# Patient Record
Sex: Male | Born: 1950 | Race: Black or African American | Hispanic: No | Marital: Single | Smoking: Never smoker
Health system: Southern US, Community
[De-identification: ages and names within clinical notes are randomized; demographics above are authoritative.]

## PROBLEM LIST (undated history)

## (undated) DIAGNOSIS — M1990 Unspecified osteoarthritis, unspecified site: Secondary | ICD-10-CM

## (undated) DIAGNOSIS — I10 Essential (primary) hypertension: Secondary | ICD-10-CM

## (undated) DIAGNOSIS — R310 Gross hematuria: Principal | ICD-10-CM

## (undated) DIAGNOSIS — H3530 Unspecified macular degeneration: Secondary | ICD-10-CM

## (undated) DIAGNOSIS — M5136 Other intervertebral disc degeneration, lumbar region: Secondary | ICD-10-CM

## (undated) DIAGNOSIS — M1710 Unilateral primary osteoarthritis, unspecified knee: Secondary | ICD-10-CM

## (undated) DIAGNOSIS — Z87442 Personal history of urinary calculi: Secondary | ICD-10-CM

## (undated) DIAGNOSIS — M545 Low back pain: Secondary | ICD-10-CM

## (undated) DIAGNOSIS — I890 Lymphedema, not elsewhere classified: Secondary | ICD-10-CM

## (undated) DIAGNOSIS — E785 Hyperlipidemia, unspecified: Secondary | ICD-10-CM

## (undated) DIAGNOSIS — G8929 Other chronic pain: Secondary | ICD-10-CM

## (undated) HISTORY — DX: Essential (primary) hypertension: I10

## (undated) HISTORY — DX: Unspecified macular degeneration: H35.30

## (undated) HISTORY — DX: Other chronic pain: G89.29

## (undated) HISTORY — DX: Lymphedema, not elsewhere classified: I89.0

## (undated) HISTORY — DX: Low back pain: M54.5

## (undated) HISTORY — DX: Other intervertebral disc degeneration, lumbar region: M51.36

## (undated) HISTORY — DX: Unspecified osteoarthritis, unspecified site: M19.90

## (undated) HISTORY — DX: Unilateral primary osteoarthritis, unspecified knee: M17.10

## (undated) HISTORY — DX: Personal history of urinary calculi: Z87.442

## (undated) HISTORY — DX: Hyperlipidemia, unspecified: E78.5

## (undated) HISTORY — DX: Gross hematuria: R31.0

---

## 2003-11-01 ENCOUNTER — Encounter: Admission: RE | Admit: 2003-11-01 | Discharge: 2004-01-30 | Payer: Self-pay | Admitting: Internal Medicine

## 2004-08-17 ENCOUNTER — Ambulatory Visit: Payer: Self-pay | Admitting: Internal Medicine

## 2005-09-05 ENCOUNTER — Ambulatory Visit: Payer: Self-pay | Admitting: Internal Medicine

## 2005-10-29 ENCOUNTER — Ambulatory Visit: Payer: Self-pay | Admitting: Internal Medicine

## 2006-10-16 ENCOUNTER — Ambulatory Visit: Payer: Self-pay | Admitting: Internal Medicine

## 2006-10-16 LAB — CONVERTED CEMR LAB
ALT: 28 units/L (ref 0–40)
Alkaline Phosphatase: 69 units/L (ref 39–117)
BUN: 16 mg/dL (ref 6–23)
Basophils Relative: 0.8 % (ref 0.0–1.0)
CO2: 32 meq/L (ref 19–32)
Calcium: 9.6 mg/dL (ref 8.4–10.5)
Chol/HDL Ratio, serum: 3.1
Cholesterol: 158 mg/dL (ref 0–200)
Creatinine,U: 186.1 mg/dL
Hemoglobin: 16.2 g/dL (ref 13.0–17.0)
Ketones, ur: NEGATIVE mg/dL
LDL Cholesterol: 77 mg/dL (ref 0–99)
Lymphocytes Relative: 27 % (ref 12.0–46.0)
Neutrophils Relative %: 59 % (ref 43.0–77.0)
Nitrite: NEGATIVE
PSA: 0.46 ng/mL (ref 0.10–4.00)
Potassium: 4.5 meq/L (ref 3.5–5.1)
TSH: 1.91 microintl units/mL (ref 0.35–5.50)
Total Protein: 7.1 g/dL (ref 6.0–8.3)
Urine Glucose: NEGATIVE mg/dL
VLDL: 30 mg/dL (ref 0–40)
WBC: 9.5 10*3/uL (ref 4.5–10.5)
pH: 6 (ref 5.0–8.0)

## 2006-12-04 ENCOUNTER — Ambulatory Visit: Payer: Self-pay | Admitting: Internal Medicine

## 2006-12-04 LAB — CONVERTED CEMR LAB
CO2: 29 meq/L (ref 19–32)
Cholesterol: 121 mg/dL (ref 0–200)
GFR calc Af Amer: 130 mL/min
Glucose, Bld: 139 mg/dL — ABNORMAL HIGH (ref 70–99)
Potassium: 4.3 meq/L (ref 3.5–5.1)
Total CHOL/HDL Ratio: 3
Triglycerides: 149 mg/dL (ref 0–149)

## 2007-06-13 ENCOUNTER — Encounter: Payer: Self-pay | Admitting: Internal Medicine

## 2007-06-13 DIAGNOSIS — E785 Hyperlipidemia, unspecified: Secondary | ICD-10-CM

## 2007-06-13 DIAGNOSIS — M545 Low back pain, unspecified: Secondary | ICD-10-CM | POA: Insufficient documentation

## 2007-06-13 DIAGNOSIS — E119 Type 2 diabetes mellitus without complications: Secondary | ICD-10-CM | POA: Insufficient documentation

## 2007-06-13 DIAGNOSIS — G4733 Obstructive sleep apnea (adult) (pediatric): Secondary | ICD-10-CM | POA: Insufficient documentation

## 2007-06-13 DIAGNOSIS — Z87442 Personal history of urinary calculi: Secondary | ICD-10-CM | POA: Insufficient documentation

## 2007-06-13 DIAGNOSIS — M1990 Unspecified osteoarthritis, unspecified site: Secondary | ICD-10-CM | POA: Insufficient documentation

## 2007-06-13 DIAGNOSIS — I10 Essential (primary) hypertension: Secondary | ICD-10-CM | POA: Insufficient documentation

## 2008-08-04 ENCOUNTER — Ambulatory Visit: Payer: Self-pay | Admitting: Family Medicine

## 2008-10-17 ENCOUNTER — Encounter (INDEPENDENT_AMBULATORY_CARE_PROVIDER_SITE_OTHER): Payer: Self-pay | Admitting: Family Medicine

## 2008-10-17 ENCOUNTER — Ambulatory Visit: Payer: Self-pay | Admitting: Internal Medicine

## 2008-10-17 LAB — CONVERTED CEMR LAB
Alkaline Phosphatase: 65 units/L (ref 39–117)
Creatinine, Ser: 0.69 mg/dL (ref 0.40–1.50)
Eosinophils Absolute: 0.5 10*3/uL (ref 0.0–0.7)
Eosinophils Relative: 5 % (ref 0–5)
Glucose, Bld: 132 mg/dL — ABNORMAL HIGH (ref 70–99)
HCT: 49.5 % (ref 39.0–52.0)
HDL: 46 mg/dL (ref >39)
LDL Cholesterol: 138 mg/dL — ABNORMAL HIGH (ref 0–99)
Lymphs Abs: 3.1 10*3/uL (ref 0.7–4.0)
MCV: 92 fL (ref 78.0–100.0)
Platelets: 224 10*3/uL (ref 150–400)
RDW: 14.7 % (ref 11.5–15.5)
Sodium: 140 meq/L (ref 135–145)
Total Bilirubin: 0.7 mg/dL (ref 0.3–1.2)
Total CHOL/HDL Ratio: 4.5
Total Protein: 6.9 g/dL (ref 6.0–8.3)
Triglycerides: 111 mg/dL (ref <150)
VLDL: 22 mg/dL (ref 0–40)
WBC: 9.3 10*3/uL (ref 4.0–10.5)

## 2008-11-29 ENCOUNTER — Ambulatory Visit: Payer: Self-pay | Admitting: Family Medicine

## 2008-11-29 ENCOUNTER — Ambulatory Visit: Payer: Self-pay | Admitting: *Deleted

## 2008-12-06 ENCOUNTER — Ambulatory Visit: Payer: Self-pay | Admitting: Family Medicine

## 2009-01-10 ENCOUNTER — Ambulatory Visit: Payer: Self-pay | Admitting: Internal Medicine

## 2009-03-15 ENCOUNTER — Ambulatory Visit: Payer: Self-pay | Admitting: Family Medicine

## 2009-03-30 ENCOUNTER — Ambulatory Visit: Payer: Self-pay | Admitting: Family Medicine

## 2009-05-22 ENCOUNTER — Ambulatory Visit: Payer: Self-pay | Admitting: Family Medicine

## 2009-05-29 ENCOUNTER — Encounter (HOSPITAL_BASED_OUTPATIENT_CLINIC_OR_DEPARTMENT_OTHER): Admission: RE | Admit: 2009-05-29 | Discharge: 2009-07-06 | Payer: Self-pay | Admitting: General Surgery

## 2009-05-30 ENCOUNTER — Ambulatory Visit (HOSPITAL_COMMUNITY): Admission: RE | Admit: 2009-05-30 | Discharge: 2009-05-30 | Payer: Self-pay | Admitting: General Surgery

## 2009-06-14 ENCOUNTER — Ambulatory Visit: Payer: Self-pay | Admitting: Vascular Surgery

## 2009-06-14 ENCOUNTER — Ambulatory Visit: Admission: RE | Admit: 2009-06-14 | Discharge: 2009-06-14 | Payer: Self-pay | Admitting: General Surgery

## 2009-06-14 ENCOUNTER — Encounter (HOSPITAL_BASED_OUTPATIENT_CLINIC_OR_DEPARTMENT_OTHER): Payer: Self-pay | Admitting: General Surgery

## 2009-10-26 ENCOUNTER — Ambulatory Visit: Payer: Self-pay | Admitting: Family Medicine

## 2009-11-23 ENCOUNTER — Encounter (HOSPITAL_BASED_OUTPATIENT_CLINIC_OR_DEPARTMENT_OTHER): Admission: RE | Admit: 2009-11-23 | Discharge: 2009-12-18 | Payer: Self-pay | Admitting: Internal Medicine

## 2009-12-26 ENCOUNTER — Ambulatory Visit: Payer: Self-pay | Admitting: Family Medicine

## 2009-12-26 LAB — CONVERTED CEMR LAB
Albumin: 4.2 g/dL (ref 3.5–5.2)
BUN: 18 mg/dL (ref 6–23)
CO2: 24 meq/L (ref 19–32)
Calcium: 9.2 mg/dL (ref 8.4–10.5)
Chloride: 97 meq/L (ref 96–112)
Cholesterol: 222 mg/dL — ABNORMAL HIGH (ref 0–200)
Creatinine, Ser: 0.64 mg/dL (ref 0.40–1.50)
Glucose, Bld: 134 mg/dL — ABNORMAL HIGH (ref 70–99)
HDL: 52 mg/dL (ref >39)
Total CHOL/HDL Ratio: 4.3

## 2010-02-26 ENCOUNTER — Ambulatory Visit: Payer: Self-pay | Admitting: Family Medicine

## 2010-02-26 LAB — CONVERTED CEMR LAB
Albumin: 4.1 g/dL (ref 3.5–5.2)
Alkaline Phosphatase: 58 units/L (ref 39–117)
BUN: 21 mg/dL (ref 6–23)
CO2: 26 meq/L (ref 19–32)
Cholesterol: 183 mg/dL (ref 0–200)
Glucose, Bld: 170 mg/dL — ABNORMAL HIGH (ref 70–99)
HDL: 56 mg/dL (ref >39)
LDL Cholesterol: 109 mg/dL — ABNORMAL HIGH (ref 0–99)
Total Bilirubin: 0.6 mg/dL (ref 0.3–1.2)
Triglycerides: 89 mg/dL (ref <150)
VLDL: 18 mg/dL (ref 0–40)

## 2010-08-22 ENCOUNTER — Encounter (HOSPITAL_BASED_OUTPATIENT_CLINIC_OR_DEPARTMENT_OTHER)
Admission: RE | Admit: 2010-08-22 | Discharge: 2010-09-17 | Payer: Self-pay | Source: Home / Self Care | Attending: General Surgery | Admitting: General Surgery

## 2010-12-26 LAB — GLUCOSE, CAPILLARY: Glucose-Capillary: 131 mg/dL — ABNORMAL HIGH (ref 70–99)

## 2011-01-11 LAB — GLUCOSE, CAPILLARY: Glucose-Capillary: 141 mg/dL — ABNORMAL HIGH (ref 70–99)

## 2011-01-12 LAB — COMPREHENSIVE METABOLIC PANEL
Albumin: 4 g/dL (ref 3.5–5.2)
BUN: 17 mg/dL (ref 6–23)
Calcium: 9.1 mg/dL (ref 8.4–10.5)
Creatinine, Ser: 0.86 mg/dL (ref 0.4–1.5)
Glucose, Bld: 147 mg/dL — ABNORMAL HIGH (ref 70–99)
Potassium: 4.4 mEq/L (ref 3.5–5.1)
Total Protein: 7.4 g/dL (ref 6.0–8.3)

## 2011-01-12 LAB — CBC
HCT: 50 % (ref 39.0–52.0)
MCHC: 34 g/dL (ref 30.0–36.0)
Platelets: 280 10*3/uL (ref 150–400)
RDW: 14 % (ref 11.5–15.5)

## 2011-01-12 LAB — GLUCOSE, CAPILLARY

## 2011-01-12 LAB — DIFFERENTIAL
Lymphocytes Relative: 23 % (ref 12–46)
Lymphs Abs: 2.6 10*3/uL (ref 0.7–4.0)
Monocytes Absolute: 0.9 10*3/uL (ref 0.1–1.0)
Monocytes Relative: 8 % (ref 3–12)
Neutro Abs: 7.6 10*3/uL (ref 1.7–7.7)
Neutrophils Relative %: 67 % (ref 43–77)

## 2011-02-19 NOTE — Assessment & Plan Note (Signed)
[facility]   NAME:  Carlos Wood, Carlos Wood           ACCOUNT NO.:  [id number]   MEDICAL RECORD NO.:  [id number]      DATE OF BIRTH:  1951-07-21   PHYSICIAN:  Leonie Man, M.D.         VISIT DATE:                                   OFFICE VISIT   PROBLEM:  1. Stasis dermatitis with venous leg ulcer left lower extremity      dimensions 3.2 x 1.5 x 0.1.  2. Type 2 diabetes mellitus.  3. Morbid obesity (475 pounds at 6 feet.)   Carlos Wood is a 60 year old gentleman referred courtesy of the  [facility] for evaluation and treatment of his venous leg ulcer and  stasis dermatitis.  In recent weeks he has been treated with Augmentin  and topical steroids without resolution of his signs and symptoms of  erythema and rash as well as an ulcer on the left leg.  He is currently  on Augmentin 875 mg b.i.d.   ALLERGIES:  No known drug allergies.   PAST MEDICAL HISTORY:  Medical problems:  Hypertension, type 2 diabetes  mellitus and obesity.   Surgical history:  No previous surgery.   MEDICATIONS:  1. Amlodipine 5 mg daily.  2. Augmentin 875 mg daily.  3. Betamethasone dipropionate 0.05% applied to legs b.i.d.  4. Furosemide 40 mg b.i.d.  5. Lisinopril 40 mg daily.  6. Metformin 1000 mg b.i.d.  7. Metoprolol 25 mg 1 b.i.d.  8. Trental 400 mg t.i.d.  9. Darvocet 1 tablet p.r.n.   The patient takes the following supplements,  1. Aspirin 81 mg daily.  2. Horse chestnut 300 mg daily.  3. Niacin 1000 mg daily.  4. Fish oil omega III 1000 mg b.i.d.  5. Phytosterols 400 mg b.i.d.  6. Vitamin D 10,000 units daily.   SOCIAL HISTORY:  The patient is a single white English-speaking male.  No tobacco, alcohol or recreational drug use.   PRIMARY CARE PHYSICIAN:  Dr. Audria Nine at [facility].   REVIEW OF SYSTEMS:  Negative in detail except as outlined above in the  present illness and past medical history.   PHYSICAL EXAMINATION:  Alert, oriented.  VITAL SIGNS:   Temperature 98, pulse 61, respirations 20, blood pressure  137/84.  Capillary blood g0lucose 154.  NECK:  Head normocephalic.  EYES:  No scleral icterus.  Oropharynx is benign.  NECK:  Supple.  No palpable thyromegaly or adenopathy.  No jugular  venous distention or no carotid bruits.  LUNGS:  Clear to auscultation bilaterally, although breath sounds are  quite distant.  HEART:  Regular rate and rhythm without murmurs heard.  ABDOMEN: Obese.  No masses or palpable visceromegaly.  No palpable  hernias.  Normoactive bowel sounds.  EXTREMITIES:  Extremity exam showed bilateral edema.  Distal pulses are  not palpable probably secondary to edema.  Distal feet are warm to  palpation.  There is significant erythema of the legs extending from the  feet up to the knees.  There is a discoid rash above the knees which is  not identifiable to me but reminiscence of ringworm.  His calf  measurements on the right are 17.5 cm and on the left 20 cm, ankle  measurements on the right to 10.5 cm on the left  12.5 cm.   The treatment today to the ulcer itself.  We will put on Silver  Alginate.  His skin is covered with ketoconazole with TCA cream. We have  applied bilateral Unna boots.  We will ask him to continue his  Augmentin.  We have scheduled for him arterial and venous Dopplers of  the legs.  Follow up will be in 1 week for [name].      Leonie Man, M.D.  Electronically Signed     PB/MEDQ  D:  05/30/2009  T:  05/31/2009  Job:  [id number]

## 2011-02-19 NOTE — Assessment & Plan Note (Signed)
[facility]   NAME:  Carlos Wood, Carlos Wood           ACCOUNT NO.:  [id number]   MEDICAL RECORD NO.:  [id number]      DATE OF BIRTH:  1950/10/10   PHYSICIAN:  Leonie Man, M.D.    VISIT DATE:  06/06/2009                                   OFFICE VISIT   PROBLEM:  Stasis dermatitis with venous leg ulcer of the left lower  extremity, current dimensions 2.9 x 1.4 x 0.1 which is reduced from his  last most recent visit.   [name] is a 60 year old man with the above-stated stasis  dermatitis and venous leg ulcer.  He returns now for reevaluation of his  wound.  On his last visit, he was treated with topical skin barrier of  TCA ketoconazole followed by silver alginate dressing to his leg wound  and an Radio broadcast assistant.   Examination of the leg is better than when seen last.  The target-like  rash that was seen on his leg is significantly less after he had been on  ciprofloxacin for a pseudomonas infection that was picked up at the  cultures done at his most recent visit.  The patient's vital signs are  temperature 97.6, pulse 67, respirations 20, blood pressure 125/74.   The area of the wound in the medial aspect of left ankle, there is a  fairly angry-looking stasis dermatitis, although the ulcer has gotten  smaller over the past week.  This area is selectively debrided with a  curette.  Wound dressings are with ketoconazole TCA barrier cream  followed by Silvercel to the leg wound followed by an Unna boot  bilaterally and Xtrasorb to the left leg area under [place]an.   Followup for this patient will be in 1 week.      Leonie Man, M.D.  Electronically Signed     PB/MEDQ  D:  06/06/2009  T:  06/07/2009  Job:  [id number]

## 2011-07-14 ENCOUNTER — Encounter: Payer: Self-pay | Admitting: Internal Medicine

## 2011-07-14 DIAGNOSIS — Z0000 Encounter for general adult medical examination without abnormal findings: Secondary | ICD-10-CM | POA: Insufficient documentation

## 2011-07-17 ENCOUNTER — Other Ambulatory Visit: Payer: Self-pay | Admitting: Internal Medicine

## 2011-07-17 ENCOUNTER — Other Ambulatory Visit (INDEPENDENT_AMBULATORY_CARE_PROVIDER_SITE_OTHER): Payer: Medicare Other

## 2011-07-17 ENCOUNTER — Encounter: Payer: Self-pay | Admitting: Internal Medicine

## 2011-07-17 ENCOUNTER — Ambulatory Visit (INDEPENDENT_AMBULATORY_CARE_PROVIDER_SITE_OTHER): Payer: Medicare Other | Admitting: Internal Medicine

## 2011-07-17 VITALS — BP 132/72 | HR 62 | Temp 98.6°F | Ht 72.0 in | Wt >= 6400 oz

## 2011-07-17 DIAGNOSIS — E785 Hyperlipidemia, unspecified: Secondary | ICD-10-CM

## 2011-07-17 DIAGNOSIS — E119 Type 2 diabetes mellitus without complications: Secondary | ICD-10-CM

## 2011-07-17 DIAGNOSIS — M51369 Other intervertebral disc degeneration, lumbar region without mention of lumbar back pain or lower extremity pain: Secondary | ICD-10-CM

## 2011-07-17 DIAGNOSIS — G8929 Other chronic pain: Secondary | ICD-10-CM | POA: Insufficient documentation

## 2011-07-17 DIAGNOSIS — R310 Gross hematuria: Secondary | ICD-10-CM

## 2011-07-17 DIAGNOSIS — Z87442 Personal history of urinary calculi: Secondary | ICD-10-CM | POA: Insufficient documentation

## 2011-07-17 DIAGNOSIS — M5136 Other intervertebral disc degeneration, lumbar region: Secondary | ICD-10-CM

## 2011-07-17 DIAGNOSIS — I10 Essential (primary) hypertension: Secondary | ICD-10-CM

## 2011-07-17 DIAGNOSIS — R5383 Other fatigue: Secondary | ICD-10-CM | POA: Insufficient documentation

## 2011-07-17 DIAGNOSIS — M545 Low back pain, unspecified: Secondary | ICD-10-CM | POA: Insufficient documentation

## 2011-07-17 DIAGNOSIS — H3530 Unspecified macular degeneration: Secondary | ICD-10-CM

## 2011-07-17 DIAGNOSIS — M179 Osteoarthritis of knee, unspecified: Secondary | ICD-10-CM

## 2011-07-17 DIAGNOSIS — R5381 Other malaise: Secondary | ICD-10-CM

## 2011-07-17 DIAGNOSIS — M1710 Unilateral primary osteoarthritis, unspecified knee: Secondary | ICD-10-CM

## 2011-07-17 DIAGNOSIS — I890 Lymphedema, not elsewhere classified: Secondary | ICD-10-CM

## 2011-07-17 HISTORY — DX: Lymphedema, not elsewhere classified: I89.0

## 2011-07-17 HISTORY — DX: Other intervertebral disc degeneration, lumbar region: M51.36

## 2011-07-17 HISTORY — DX: Unilateral primary osteoarthritis, unspecified knee: M17.10

## 2011-07-17 HISTORY — DX: Low back pain, unspecified: M54.50

## 2011-07-17 HISTORY — DX: Unspecified macular degeneration: H35.30

## 2011-07-17 HISTORY — DX: Other chronic pain: G89.29

## 2011-07-17 HISTORY — DX: Other intervertebral disc degeneration, lumbar region without mention of lumbar back pain or lower extremity pain: M51.369

## 2011-07-17 HISTORY — DX: Gross hematuria: R31.0

## 2011-07-17 HISTORY — DX: Osteoarthritis of knee, unspecified: M17.9

## 2011-07-17 LAB — CBC WITH DIFFERENTIAL/PLATELET
Basophils Absolute: 0.1 10*3/uL (ref 0.0–0.1)
Eosinophils Absolute: 0.5 10*3/uL (ref 0.0–0.7)
HCT: 47.3 % (ref 39.0–52.0)
Hemoglobin: 15.8 g/dL (ref 13.0–17.0)
Lymphs Abs: 3.6 10*3/uL (ref 0.7–4.0)
MCHC: 33.4 g/dL (ref 30.0–36.0)
Monocytes Absolute: 1 10*3/uL (ref 0.1–1.0)
Neutro Abs: 6.1 10*3/uL (ref 1.4–7.7)
Platelets: 253 10*3/uL (ref 150.0–400.0)
RDW: 14.2 % (ref 11.5–14.6)

## 2011-07-17 LAB — BASIC METABOLIC PANEL
CO2: 29 mEq/L (ref 19–32)
Calcium: 9.8 mg/dL (ref 8.4–10.5)
Chloride: 97 mEq/L (ref 96–112)
Creatinine, Ser: 0.7 mg/dL (ref 0.4–1.5)
Glucose, Bld: 133 mg/dL — ABNORMAL HIGH (ref 70–99)
Sodium: 137 mEq/L (ref 135–145)

## 2011-07-17 LAB — HEPATIC FUNCTION PANEL
AST: 24 U/L (ref 0–37)
Alkaline Phosphatase: 65 U/L (ref 39–117)
Bilirubin, Direct: 0.1 mg/dL (ref 0.0–0.3)

## 2011-07-17 LAB — URINALYSIS, ROUTINE W REFLEX MICROSCOPIC
Ketones, ur: NEGATIVE
Specific Gravity, Urine: 1.025 (ref 1.000–1.030)
Urine Glucose: NEGATIVE
Urobilinogen, UA: 0.2 (ref 0.0–1.0)
pH: 5.5 (ref 5.0–8.0)

## 2011-07-17 LAB — LIPID PANEL
Cholesterol: 222 mg/dL — ABNORMAL HIGH (ref 0–200)
Total CHOL/HDL Ratio: 4
Triglycerides: 284 mg/dL — ABNORMAL HIGH (ref 0.0–149.0)

## 2011-07-17 LAB — MICROALBUMIN / CREATININE URINE RATIO
Creatinine,U: 105.2 mg/dL
Microalb Creat Ratio: 2.8 mg/g (ref 0.0–30.0)

## 2011-07-17 LAB — TSH: TSH: 3.82 u[IU]/mL (ref 0.35–5.50)

## 2011-07-17 MED ORDER — METOPROLOL TARTRATE 25 MG PO TABS: 25.0000 mg | ORAL_TABLET | Freq: Two times a day (BID) | ORAL | Status: DC

## 2011-07-17 MED ORDER — AMLODIPINE BESYLATE 5 MG PO TABS: 5.0000 mg | ORAL_TABLET | Freq: Every day | ORAL | Status: DC

## 2011-07-17 MED ORDER — METFORMIN HCL 500 MG PO TABS: ORAL_TABLET | ORAL | Status: DC

## 2011-07-17 MED ORDER — LISINOPRIL 40 MG PO TABS: 40.0000 mg | ORAL_TABLET | Freq: Every day | ORAL | Status: DC

## 2011-07-17 MED ORDER — ATORVASTATIN CALCIUM 10 MG PO TABS: 10.0000 mg | ORAL_TABLET | Freq: Every day | ORAL | Status: DC

## 2011-07-17 NOTE — Assessment & Plan Note (Signed)
Etiology unclear, Exam otherwise benign, to check labs as documented, follow with expectant management  

## 2011-07-17 NOTE — Assessment & Plan Note (Signed)
stable overall by hx and exam, most recent data reviewed with pt, and pt to continue medical treatment as before  Lab Results  Component Value Date   HGBA1C 7.3* 12/04/2006

## 2011-07-17 NOTE — Patient Instructions (Addendum)
Take all new medications as prescribed - the lipitor Continue all other medications as before All refills were sent to your pharmacy Please go to LAB in the Basement for the blood and/or urine tests to be done today Please call the phone number [phone] (the PhoneTree System) for results of testing in 2-3 days;  When calling, simply dial the number, and when prompted enter the MRN number above (the Medical Record Number) and the # key, then the message should start. You will be contacted regarding the referral for: urology Please return in 6 months, or sooner if needed

## 2011-07-17 NOTE — Assessment & Plan Note (Signed)
stable overall by hx and exam, most recent data reviewed with pt, and pt to continue medical treatment as before le  BP Readings from Last 3 Encounters:  07/17/11 132/72  12/04/06 152/86

## 2011-07-17 NOTE — Assessment & Plan Note (Signed)
1 episode painless about 2 mo ago; given age and risk factors including hx of actos use in the past, will refer to urology for further eval, for UA today

## 2011-07-17 NOTE — Progress Notes (Signed)
  Subjective:    Patient ID: Carlos Wood, male    DOB: May 22, 1951, 60 y.o.   MRN: [id number]  HPI  Here to f/u; overall doing ok,  Pt denies chest pain, increased sob or doe, wheezing, orthopnea, PND, increased LE swelling, palpitations, dizziness or syncope.  Pt denies new neurological symptoms such as new headache, or facial or extremity weakness or numbness   Pt denies polydipsia, polyuria, or low sugar symptoms such as weakness or confusion improved with po intake.  Pt states overall good compliance with meds, trying to follow lower cholesterol, diabetic diet, wt overall stable but little exercise however. Declines flu shot and tetanus.  Does mention he has remote hx of kidney stones.  Did have episode of painless hematuria 2 mo ago, no fever and none since then.   Does have sense of ongoing fatigue, but denies signficant hypersomnolence.  Past Medical History  Diagnosis Date  . Arthritis   . Diabetes mellitus   . Hyperlipidemia   . Hypertension   . Macular degeneration of left eye 07/17/2011  . Arthritis of knee, degenerative 07/17/2011  . Chronic LBP 07/17/2011  . DDD (degenerative disc disease), lumbar 07/17/2011  . Acquired lymphedema of leg 07/17/2011  . History of kidney stones    No past surgical history on file.  reports that he has never smoked. He does not have any smokeless tobacco history on file. He reports that he does not drink alcohol or use illicit drugs. family history includes Arthritis in his other; Hyperlipidemia in his others; and Stroke in his other. Allergies  Allergen Reactions  . Crestor (Rosuvastatin Calcium)     Myalgia  . Pravastatin     Myalgia, mental foggy   No current outpatient prescriptions on file prior to visit.   Review of Systems Review of Systems  Constitutional: Negative for diaphoresis and unexpected weight change.  HENT: Negative for drooling and tinnitus.   Eyes: Negative for photophobia and visual disturbance.  Respiratory:  Negative for choking and stridor.   Gastrointestinal: Negative for vomiting and blood in stool.  Genitourinary: Negative for hematuria and decreased urine volume.  Musculoskeletal: Negative for gait problem.  Skin: Negative for color change and wound.  Neurological: Negative for tremors and numbness.  Psychiatric/Behavioral: Negative for decreased concentration. The patient is not hyperactive.      Objective:   Physical Exam BP 132/72  Pulse 62  Temp(Src) 98.6 F (37 C) (Oral)  Ht 6' (1.829 m)  Wt 443 lb 6 oz (201.114 kg)  BMI 60.13 kg/m2  SpO2 96% Physical Exam  VS noted Constitutional: Pt appears well-developed and well-nourished.  HENT: Head: Normocephalic.  Right Ear: External ear normal.  Left Ear: External ear normal.  Eyes: Conjunctivae and EOM are normal. Pupils are equal, round, and reactive to light.  Neck: Normal range of motion. Neck supple.  Cardiovascular: Normal rate and regular rhythm.   Pulmonary/Chest: Effort normal and breath sounds normal.  Abd:  Soft, NT, non-distended, + BS Neurological: Pt is alert. No cranial nerve deficit.  Skin: Skin is warm. No erythema. Has chronic bilat 1-2+ LE lymphedema Psychiatric: Pt behavior is normal. Thought content normal.     Assessment & Plan:

## 2011-07-17 NOTE — Assessment & Plan Note (Signed)
Likely uncontrolled, to check lipids, start lipitor  asd

## 2011-08-15 ENCOUNTER — Other Ambulatory Visit: Payer: Self-pay | Admitting: Urology

## 2011-08-19 ENCOUNTER — Telehealth: Payer: Self-pay

## 2011-08-19 DIAGNOSIS — M25559 Pain in unspecified hip: Secondary | ICD-10-CM

## 2011-08-19 DIAGNOSIS — M79606 Pain in leg, unspecified: Secondary | ICD-10-CM

## 2011-08-19 NOTE — Telephone Encounter (Signed)
Done per emr 

## 2011-08-19 NOTE — Telephone Encounter (Signed)
Pt called requesting referral to ortho for recurrent LT thigh and hip pain.

## 2011-08-20 ENCOUNTER — Ambulatory Visit
Admission: RE | Admit: 2011-08-20 | Discharge: 2011-08-20 | Disposition: A | Discharge status: Home / Self Care | Payer: Medicare Other | Source: Ambulatory Visit | Attending: Urology | Admitting: Urology

## 2011-08-20 MED ORDER — IOHEXOL 300 MG/ML  SOLN
125.0000 mL | Freq: Once | INTRAMUSCULAR | Status: AC | PRN
  Administered 2011-08-20: 125 mL via INTRAVENOUS

## 2011-08-26 NOTE — H&P (Signed)
  NAMEKAILER, HEINDEL           ACCOUNT NO.:  [id number]  MEDICAL RECORD NO.:  [id number]          PATIENT TYPE:  REC  LOCATION:  FOOT                         FACILITY:  [facility]  PHYSICIAN:  Ardath Sax, M.D.     DATE OF BIRTH:  29-Aug-1951  DATE OF ADMISSION:  08/22/2010 DATE OF DISCHARGE:                             HISTORY & PHYSICAL   Carlos Wood is a 61 year old gentleman who returns suffering from venous stasis and venous stasis ulcers, especially on the left leg.  He is morbidly obese and has had stasis dermatitis and leg ulcers for quite some time.  He has been treated in the past with TCA cream and Unna boots which I prescribed this time and he will come back in a week.  I also put him on doxycycline as he has bilateral cellulitis.  He is a diabetic and is on metformin.  He is also hypertensive and is on lisinopril and Lasix.  He also is on Trental.  The ulcers are fairly superficial and clean, so he will come back in 1 week and we will change the Unna boots.     Ardath Sax, M.D.     PP/MEDQ  D:  08/22/2010  T:  08/23/2010  Job:  [id number]  Electronically Signed by Ardath Sax  on 08/26/2011 01:35:32 PM

## 2011-10-04 ENCOUNTER — Telehealth: Payer: Self-pay | Admitting: *Deleted

## 2011-10-04 NOTE — Telephone Encounter (Signed)
Ok to reduce lasix to 40 qam, pt to follow his wt daily at home, and call for increeased swelling or wt gain > 5 lbs

## 2011-10-04 NOTE — Telephone Encounter (Signed)
Pt is wanting his furosemide modified. Pt states this medication is causing him to go to the bathroom multiple times per day. He is wanting to switch to a milder diuretic, what do you advise for pt?

## 2011-10-04 NOTE — Telephone Encounter (Signed)
Per pt he states that he has already has been taking medication once daily and still feels its too much frequent urination which makes it hard for him to leave home. He also c/o joint pain while on medication. Please advise Thanks

## 2011-10-04 NOTE — Telephone Encounter (Signed)
The Medication itself does not cause joint pain  I would cont the once daily lasix

## 2011-10-07 NOTE — Telephone Encounter (Signed)
Patient informed of MD's instructions

## 2012-01-14 ENCOUNTER — Ambulatory Visit: Payer: Medicare Other | Admitting: Internal Medicine

## 2012-03-30 ENCOUNTER — Telehealth: Payer: Self-pay

## 2012-03-30 DIAGNOSIS — I83009 Varicose veins of unspecified lower extremity with ulcer of unspecified site: Secondary | ICD-10-CM

## 2012-03-30 NOTE — Telephone Encounter (Signed)
Patient informed referral done.

## 2012-03-30 NOTE — Telephone Encounter (Signed)
Pt called requesting referral to [facility] for recurrent venostasis ulcers.

## 2012-03-30 NOTE — Telephone Encounter (Signed)
Done per emr 

## 2012-04-03 ENCOUNTER — Encounter (HOSPITAL_BASED_OUTPATIENT_CLINIC_OR_DEPARTMENT_OTHER): Payer: Medicare Other | Attending: General Surgery

## 2012-04-03 DIAGNOSIS — Z79899 Other long term (current) drug therapy: Secondary | ICD-10-CM | POA: Insufficient documentation

## 2012-04-03 DIAGNOSIS — I872 Venous insufficiency (chronic) (peripheral): Secondary | ICD-10-CM | POA: Insufficient documentation

## 2012-04-03 DIAGNOSIS — L02419 Cutaneous abscess of limb, unspecified: Secondary | ICD-10-CM | POA: Insufficient documentation

## 2012-04-03 DIAGNOSIS — L97809 Non-pressure chronic ulcer of other part of unspecified lower leg with unspecified severity: Secondary | ICD-10-CM | POA: Insufficient documentation

## 2012-04-03 NOTE — Progress Notes (Signed)
[facility]  NAME:  JONATHIN, HEINICKE           ACCOUNT NO.:  [id number]  MEDICAL RECORD NO.:  [id number]      DATE OF BIRTH:  07-09-1951  PHYSICIAN:  Ardath Sax, M.D.           VISIT DATE:                                  OFFICE VISIT   Mr. Chancellor returns to the wound clinic.  He has been a patient here several times in the past for venous stasis ulcers.  He returns now with an ulcer on the anterior aspect of his left leg, surrounded by a good 6 inches of cellulitis.  He has a history of diabetes and hypertension and obesity.  Some of the medicines he is on are Lasix, Glucophage, metoprolol, aspirin, niacin, fish oil and vitamins.  PHYSICAL EXAMINATION:  Today, his blood pressure was 173/80, respirations 18, pulse is 60, temperature 98.6.  He has an ulceration about 3 cm in diameter with surrounding cellulitis.  After reading over his chart and his treatments in the past, I elected to put him on doxycycline because of the cellulitis and we are treating him with silver alginate and an Radio broadcast assistant.  The [employer] has worked in the past, so we will treat him as has been successful in the past.  DIAGNOSIS:  Venous stasis with surrounding cellulitis of the left leg. Other diagnoses; hypertension, morbid obesity, diabetes.     Ardath Sax, M.D.     PP/MEDQ  D:  04/03/2012  T:  04/03/2012  Job:  [id number]

## 2012-04-10 ENCOUNTER — Encounter (HOSPITAL_BASED_OUTPATIENT_CLINIC_OR_DEPARTMENT_OTHER): Payer: Medicare Other | Attending: General Surgery

## 2012-04-10 DIAGNOSIS — I890 Lymphedema, not elsewhere classified: Secondary | ICD-10-CM | POA: Insufficient documentation

## 2012-04-10 DIAGNOSIS — Z79899 Other long term (current) drug therapy: Secondary | ICD-10-CM | POA: Insufficient documentation

## 2012-04-10 DIAGNOSIS — I10 Essential (primary) hypertension: Secondary | ICD-10-CM | POA: Insufficient documentation

## 2012-04-10 DIAGNOSIS — E119 Type 2 diabetes mellitus without complications: Secondary | ICD-10-CM | POA: Insufficient documentation

## 2012-04-10 DIAGNOSIS — M1710 Unilateral primary osteoarthritis, unspecified knee: Secondary | ICD-10-CM | POA: Insufficient documentation

## 2012-04-17 NOTE — Progress Notes (Signed)
[facility]  NAME:  Carlos Wood, Carlos Wood           ACCOUNT NO.:  [id number]  MEDICAL RECORD NO.:  [id number]      DATE OF BIRTH:  1951/07/25  PHYSICIAN:  Ardath Sax, M.D.           VISIT DATE:                                  OFFICE VISIT   Carlos Wood is a 61 year old gentleman who has diabetes.  He has hypertension.  He has got hyperlipidemia.  He has got marked lymphedema of his lower extremities.  He has got arthritis of his both of his knees, and he has got kidney stones.  He is on metoprolol for his hypertension.  He is on metformin for his diabetes, and he is also on metoprolol, and he is on tramadol and on a statin drug, and he is on Lasix also 40 mg a day.  This gentleman has been seen in this clinic many times starting in July of this year.  Starting in August 2010, he was seen on 6 occasions in that here and in the following year in 2011, he was seen 7 times all the way up until December 2011.  During this period of time, he was treated repeatedly with compression, therapies of various types including Unna boots.  He has also had several biologic grafts put in place and most recently here, he has been treated with Unna boots both in June 28, July 5, and July 12.  He has finally healed these venous stasis and lymphedema wounds on his legs, and I feel in order to keep him out of the Wound Clinics and out of the hospital.  It would be a medical necessity and great advantage to him if he had lymphedema pumps that he could use at home.  I think it would keep the edema out of his legs so that he would not breakdown in ulcers; therefore, we are going to make an application for home lymphedema pumps for Carlos Wood.     Ardath Sax, M.D.     PP/MEDQ  D:  04/17/2012  T:  04/17/2012  Job:  [id number]

## 2012-05-01 ENCOUNTER — Encounter (HOSPITAL_BASED_OUTPATIENT_CLINIC_OR_DEPARTMENT_OTHER): Payer: Medicare Other

## 2012-07-16 ENCOUNTER — Other Ambulatory Visit: Payer: Self-pay | Admitting: Internal Medicine

## 2012-07-20 ENCOUNTER — Encounter: Payer: Self-pay | Admitting: Internal Medicine

## 2012-07-20 ENCOUNTER — Other Ambulatory Visit (INDEPENDENT_AMBULATORY_CARE_PROVIDER_SITE_OTHER): Payer: Medicare Other

## 2012-07-20 ENCOUNTER — Ambulatory Visit (INDEPENDENT_AMBULATORY_CARE_PROVIDER_SITE_OTHER): Payer: Medicare Other | Admitting: Internal Medicine

## 2012-07-20 VITALS — BP 150/80 | HR 77 | Temp 98.2°F | Ht 72.0 in | Wt >= 6400 oz

## 2012-07-20 DIAGNOSIS — E785 Hyperlipidemia, unspecified: Secondary | ICD-10-CM

## 2012-07-20 DIAGNOSIS — E119 Type 2 diabetes mellitus without complications: Secondary | ICD-10-CM

## 2012-07-20 DIAGNOSIS — N320 Bladder-neck obstruction: Secondary | ICD-10-CM

## 2012-07-20 DIAGNOSIS — I10 Essential (primary) hypertension: Secondary | ICD-10-CM

## 2012-07-20 LAB — LIPID PANEL
LDL Cholesterol: 109 mg/dL — ABNORMAL HIGH (ref 0–99)
Total CHOL/HDL Ratio: 4

## 2012-07-20 LAB — URINALYSIS, ROUTINE W REFLEX MICROSCOPIC
Bilirubin Urine: NEGATIVE
Leukocytes, UA: NEGATIVE
Nitrite: NEGATIVE
Specific Gravity, Urine: >=1.03 (ref 1.000–1.030)
Total Protein, Urine: NEGATIVE
pH: 5.5 (ref 5.0–8.0)

## 2012-07-20 LAB — BASIC METABOLIC PANEL
Chloride: 99 mEq/L (ref 96–112)
Creatinine, Ser: 0.9 mg/dL (ref 0.4–1.5)
Potassium: 5.3 mEq/L — ABNORMAL HIGH (ref 3.5–5.1)
Sodium: 138 mEq/L (ref 135–145)

## 2012-07-20 LAB — MICROALBUMIN / CREATININE URINE RATIO: Microalb, Ur: 4.1 mg/dL — ABNORMAL HIGH (ref 0.0–1.9)

## 2012-07-20 LAB — CBC WITH DIFFERENTIAL/PLATELET
Basophils Absolute: 0.1 10*3/uL (ref 0.0–0.1)
Eosinophils Relative: 5.2 % — ABNORMAL HIGH (ref 0.0–5.0)
HCT: 48.7 % (ref 39.0–52.0)
Lymphocytes Relative: 33.2 % (ref 12.0–46.0)
Monocytes Relative: 8.6 % (ref 3.0–12.0)
Neutrophils Relative %: 52.3 % (ref 43.0–77.0)
Platelets: 255 10*3/uL (ref 150.0–400.0)
RDW: 14 % (ref 11.5–14.6)
WBC: 8.5 10*3/uL (ref 4.5–10.5)

## 2012-07-20 LAB — HEPATIC FUNCTION PANEL
ALT: 32 U/L (ref 0–53)
AST: 30 U/L (ref 0–37)
Bilirubin, Direct: 0.3 mg/dL (ref 0.0–0.3)
Total Bilirubin: 1.2 mg/dL (ref 0.3–1.2)

## 2012-07-20 LAB — HEMOGLOBIN A1C: Hgb A1c MFr Bld: 8.3 % — ABNORMAL HIGH (ref 4.6–6.5)

## 2012-07-20 MED ORDER — LISINOPRIL 40 MG PO TABS: 40.0000 mg | ORAL_TABLET | Freq: Every day | ORAL | Status: DC

## 2012-07-20 MED ORDER — FUROSEMIDE 40 MG PO TABS: 40.0000 mg | ORAL_TABLET | Freq: Two times a day (BID) | ORAL | Status: DC | PRN

## 2012-07-20 MED ORDER — AMLODIPINE BESYLATE 5 MG PO TABS: 5.0000 mg | ORAL_TABLET | Freq: Every day | ORAL | Status: DC

## 2012-07-20 MED ORDER — METOPROLOL TARTRATE 25 MG PO TABS: 25.0000 mg | ORAL_TABLET | Freq: Two times a day (BID) | ORAL | Status: DC

## 2012-07-20 MED ORDER — HYDROCHLOROTHIAZIDE 25 MG PO TABS: 25.0000 mg | ORAL_TABLET | Freq: Every day | ORAL | Status: DC

## 2012-07-20 MED ORDER — ASPIRIN 81 MG PO TBEC: 81.0000 mg | DELAYED_RELEASE_TABLET | Freq: Every day | ORAL | Status: AC

## 2012-07-20 MED ORDER — METFORMIN HCL 500 MG PO TABS: ORAL_TABLET | ORAL | Status: DC

## 2012-07-20 NOTE — Progress Notes (Signed)
Subjective:    Patient ID: Carlos Wood, male    DOB: Apr 07, 1951, 61 y.o.   MRN: [id number]  HPI  Here to f/u; overall doing ok,  Pt denies chest pain, increased sob or doe, wheezing, orthopnea, PND, increased LE swelling, palpitations, dizziness or syncope.  Pt denies new neurological symptoms such as new headache, or facial or extremity weakness or numbness   Pt denies polydipsia, polyuria, or low sugar symptoms such as weakness or confusion improved with po intake.  Pt states overall good compliance with meds, trying to follow lower cholesterol, diabetic diet, wt overall stable but little exercise however.  Did see Dr Brunilda Payor with gross hematuria due to renal stones bilat, no symptoms now, no fever, no pain, not eligible for lithotrypsy due to size. Did also see Dr Cristopher Estimable for bilat knee pain, now on nsaid prn for DJD, iliotibial band.   Pt denies fever, wt loss, night sweats, loss of appetite, or other constitutional symptoms.  Only takes the lasix infrequently due to urinary freq and incontinence - willing to try mild diuretic. Past Medical History  Diagnosis Date  . Arthritis   . Diabetes mellitus   . Hyperlipidemia   . Hypertension   . Macular degeneration of left eye 07/17/2011  . Arthritis of knee, degenerative 07/17/2011  . Chronic LBP 07/17/2011  . DDD (degenerative disc disease), lumbar 07/17/2011  . Acquired lymphedema of leg 07/17/2011  . History of kidney stones   . Gross hematuria 07/17/2011   No past surgical history on file.  reports that he has never smoked. He does not have any smokeless tobacco history on file. He reports that he does not drink alcohol or use illicit drugs. family history includes Arthritis in his other; Hyperlipidemia in his others; and Stroke in his other. Allergies  Allergen Reactions  . Crestor (Rosuvastatin Calcium)     Myalgia  . Pravastatin     Myalgia, mental foggy   Current Outpatient Prescriptions on File Prior to Visit  Medication  Sig Dispense Refill  . amLODipine (NORVASC) 5 MG tablet TAKE ONE TABLET BY MOUTH ONE TIME DAILY  30 tablet  0  . furosemide (LASIX) 40 MG tablet Take 40 mg by mouth 2 (two) times daily.        Marland Kitchen lisinopril (PRINIVIL,ZESTRIL) 40 MG tablet TAKE ONE TABLET BY MOUTH ONE TIME DAILY  30 tablet  0  . metFORMIN (GLUCOPHAGE) 500 MG tablet 2 by mouth 2 times daily  360 tablet  3  . metoprolol tartrate (LOPRESSOR) 25 MG tablet Take 1 tablet (25 mg total) by mouth 2 (two) times daily.  180 tablet  3  . atorvastatin (LIPITOR) 10 MG tablet Take 1 tablet (10 mg total) by mouth daily.  90 tablet  3   Review of Systems  Constitutional: Negative for diaphoresis and unexpected weight change.  HENT: Negative for tinnitus.   Eyes: Negative for photophobia and visual disturbance.  Respiratory: Negative for choking and stridor.   Gastrointestinal: Negative for vomiting and blood in stool.  Genitourinary: Negative for hematuria and decreased urine volume.  Musculoskeletal: Negative for gait problem.  Skin: Negative for color change and wound. except for episode venous stasis ulceration left leg - tx per Dr Jimmey Ralph, wound center Neurological: Negative for tremors and numbness.  Psychiatric/Behavioral: Negative for decreased concentration. The patient is not hyperactive.      Objective:   Physical Exam BP 150/80  Pulse 77  Temp 98.2 F (36.8 C) (Oral)  Ht 6' (1.829  m)  Wt 464 lb 4 oz (210.582 kg)  BMI 62.96 kg/m2  SpO2 94% Physical Exam  VS noted Constitutional: Pt appears well-developed and well-nourished.  HENT: Head: Normocephalic.  Right Ear: External ear normal.  Left Ear: External ear normal.  Eyes: Conjunctivae and EOM are normal. Pupils are equal, round, and reactive to light.  Neck: Normal range of motion. Neck supple.  Cardiovascular: Normal rate and regular rhythm.   Pulmonary/Chest: Effort normal and breath sounds normal.  Abd:  Soft, NT, non-distended, + BS Neurological: Pt is alert. Not  confused  Skin: Skin is warm. No erythema. chronic 1+ edema Psychiatric: Pt behavior is normal. Thought content normal.     Assessment & Plan:

## 2012-07-20 NOTE — Assessment & Plan Note (Signed)
stable overall by hx and exam, most recent data reviewed with pt, and pt to continue medical treatment as before Lab Results  Component Value Date   HGBA1C 7.1* 07/17/2011

## 2012-07-20 NOTE — Assessment & Plan Note (Signed)
Asympt, for psa as he is due,  to f/u any worsening symptoms or concerns

## 2012-07-20 NOTE — Patient Instructions (Addendum)
OK to start the HCTZ 25 mg per day - for fluid pill and blood pressure Continue all other medications as before Please go to LAB in the Basement for the blood and/or urine tests to be done today You will be contacted by phone if any changes need to be made immediately.  Otherwise, you will receive a letter about your results with an explanation. Thank you for enrolling in MyChart. Please follow the instructions below to securely access your online medical record. MyChart allows you to send messages to your doctor, view your test results, renew your prescriptions, schedule appointments, and more. Your medications were refilled today Please return in 6 months, or sooner if needed

## 2012-07-20 NOTE — Assessment & Plan Note (Signed)
Mild increased, to add HCTZ 25 qd,  to f/u any worsening symptoms or concerns BP Readings from Last 3 Encounters:  07/20/12 150/80  07/17/11 132/72  12/04/06 152/86

## 2012-07-20 NOTE — Assessment & Plan Note (Signed)
stable overall by hx and exam, most recent data reviewed with pt, and pt to continue medical treatment as before Lab Results  Component Value Date   LDLCALC 109* 02/26/2010

## 2012-07-21 ENCOUNTER — Other Ambulatory Visit: Payer: Self-pay | Admitting: Internal Medicine

## 2012-07-21 MED ORDER — GLIPIZIDE ER 2.5 MG PO TB24: 2.5000 mg | ORAL_TABLET | Freq: Every day | ORAL | Status: DC

## 2012-08-25 ENCOUNTER — Other Ambulatory Visit: Payer: Self-pay | Admitting: Internal Medicine

## 2012-11-21 ENCOUNTER — Other Ambulatory Visit: Payer: Self-pay

## 2013-01-18 ENCOUNTER — Ambulatory Visit: Payer: Medicare Other | Admitting: Internal Medicine

## 2013-04-22 IMAGING — CT CT ABD-PEL WO/W CM
4 of 10 series · 16 of 36 positions shown, 19 images · IV contrast (omnipaque)
Comparison: None.

CLINICAL DATA: Hematuria, gross hematuria

CT ABDOMEN AND PELVIS WITHOUT AND WITH CONTRAST
TECHNIQUE: Multidetector CT imaging of the abdomen and pelvis was
performed without contrast material in one or both body regions,
followed by contrast material(s) and further sections in one or
both body regions.
Contrast: 125mL OMNIPAQUE IOHEXOL 300 MG/ML IV SOLN

[Series 9: abd/pelvis prone · axial · 0.98mm/px · z∈[-260,-90]mm · 2 of 104 slices shown, 5 images]
[im 35/104  soft-tissue]
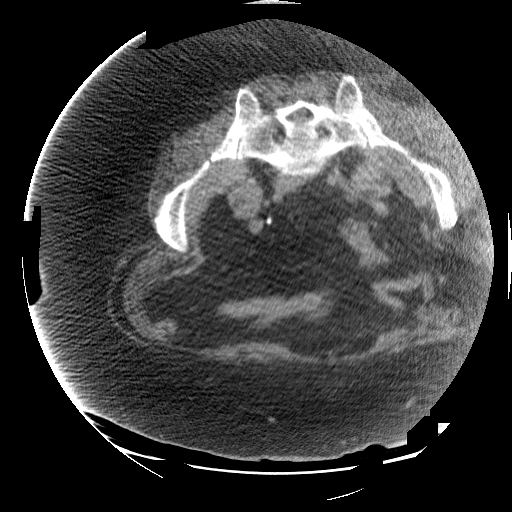
[im 35/104  lung]
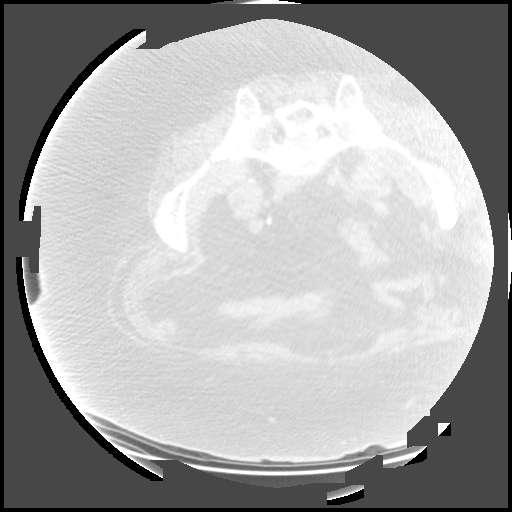
[im 35/104  bone]
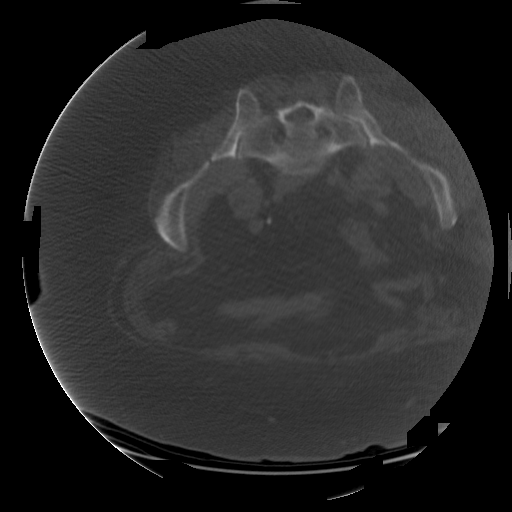
[im 69/104  soft-tissue]
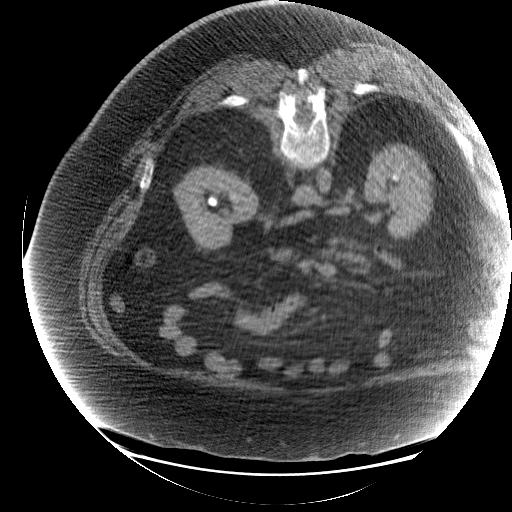
[im 69/104  lung]
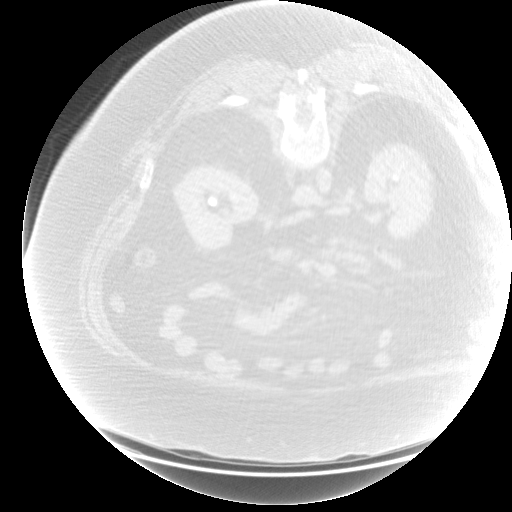

[Series 602: sagittal body · sagittal · 0.98mm/px · 5 of 199 slices shown (1 of 3)]
[im 34/199  soft-tissue]
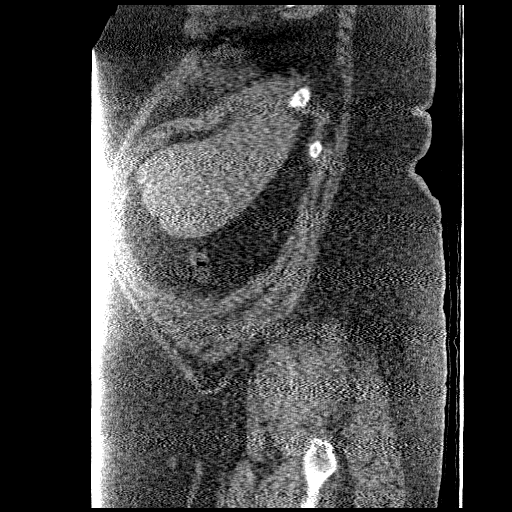
[im 67/199  soft-tissue]
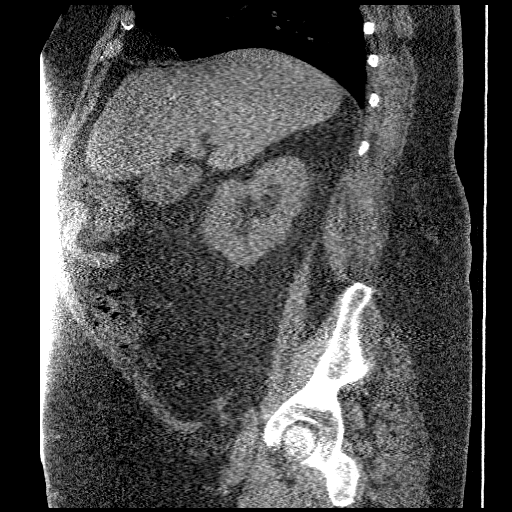
[im 100/199  soft-tissue]
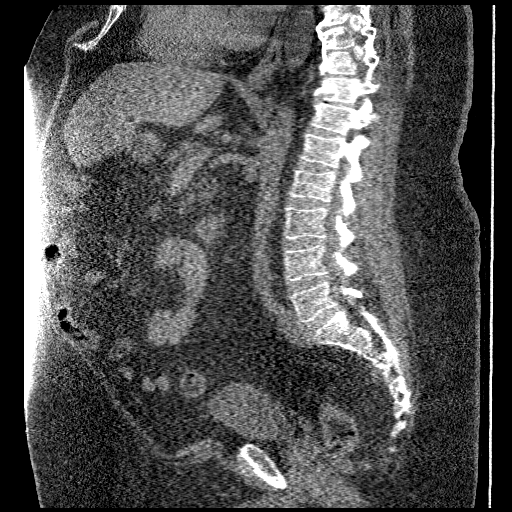
[im 133/199  soft-tissue]
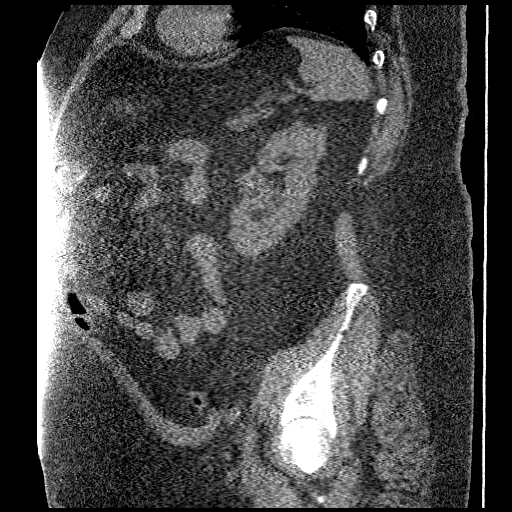
[im 166/199  soft-tissue]
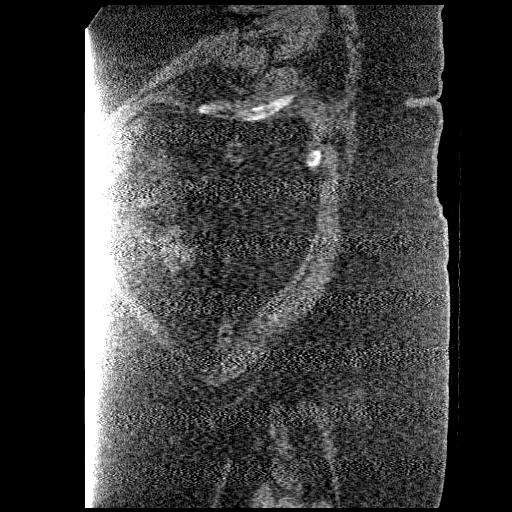

[Series 605: sagittal body · sagittal · 0.98mm/px · 5 of 198 slices shown (2 of 3)]
[im 33/198  soft-tissue]
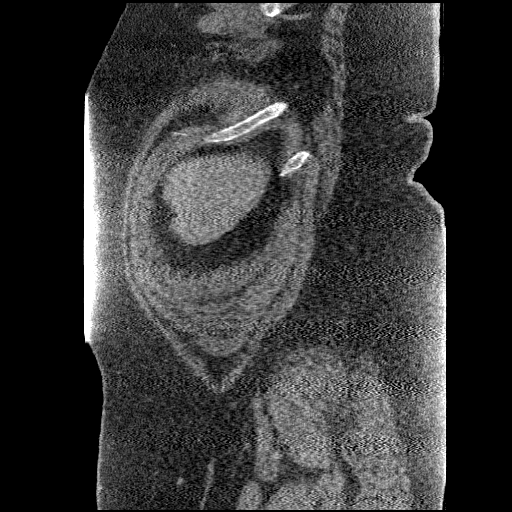
[im 66/198  soft-tissue]
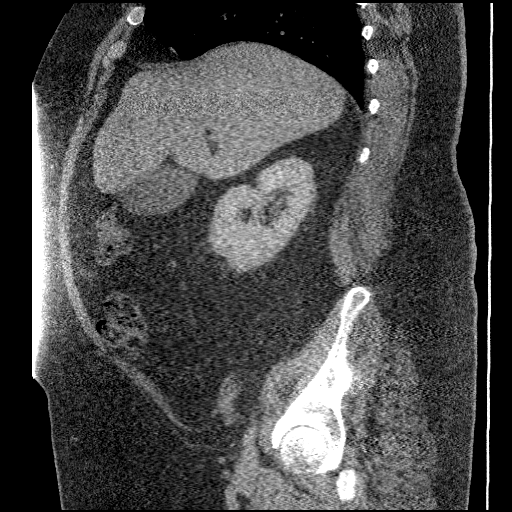
[im 99/198  soft-tissue]
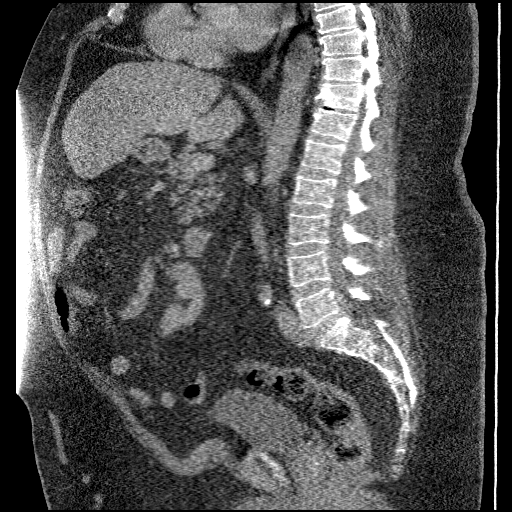
[im 132/198  soft-tissue]
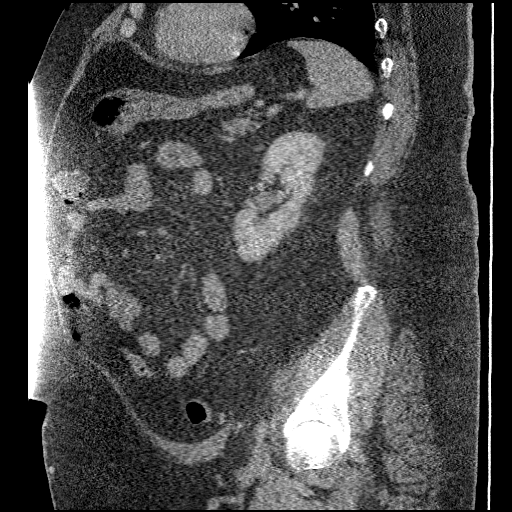
[im 165/198  soft-tissue]
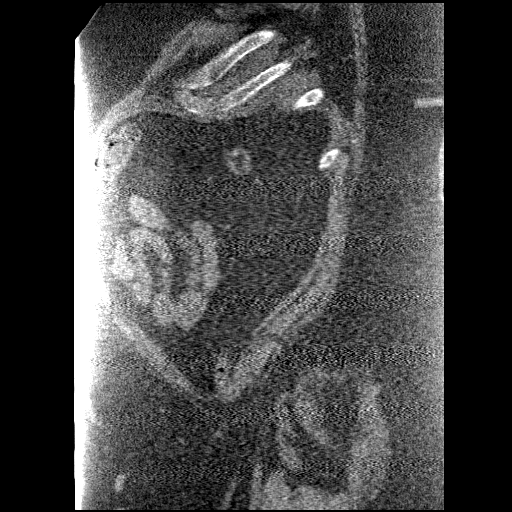

[Series 608: sagittal body · sagittal · 1.01mm/px · 4 of 193 slices shown (3 of 3)]
[im 33/193  soft-tissue]
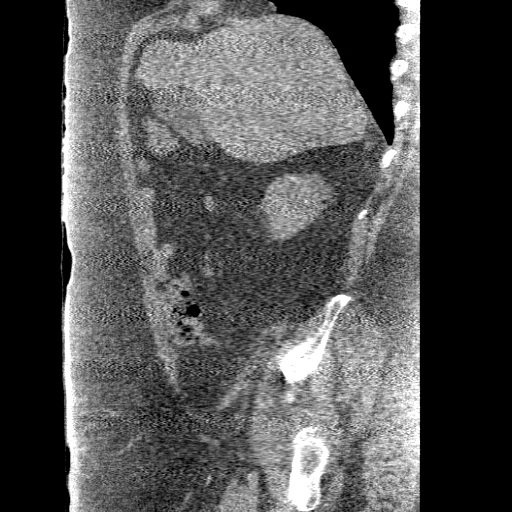
[im 65/193  soft-tissue]
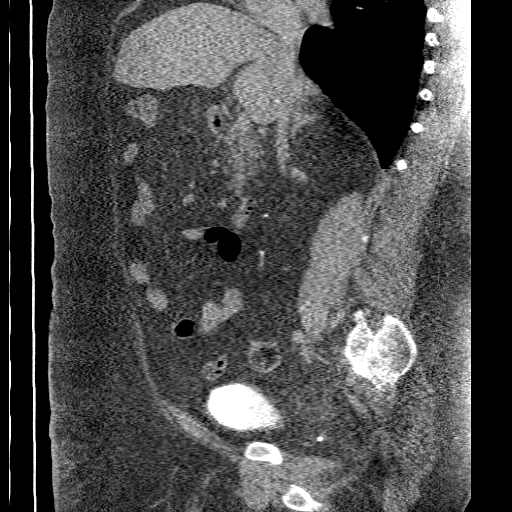
[im 97/193  soft-tissue]
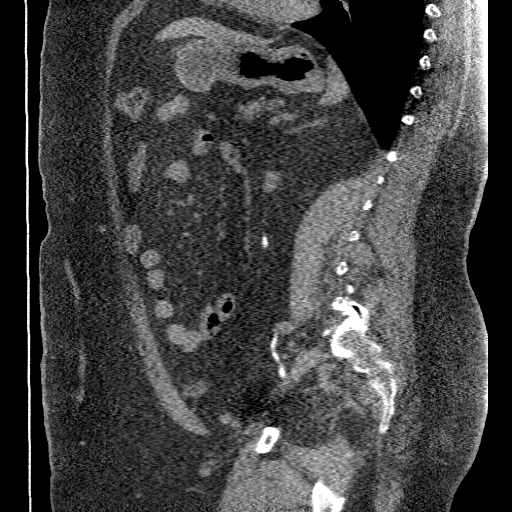
[im 129/193  soft-tissue]
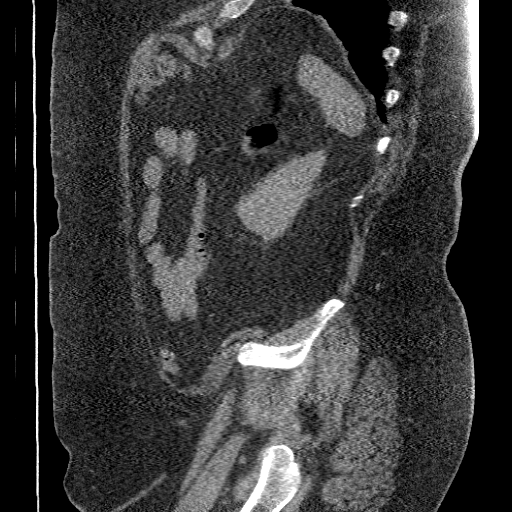

[16 of 36 positions shown; findings below may reference images not displayed]

FINDINGS: Renal:  Non-IV contrast images demonstrate a 9 mm calculus in the
right renal pelvis.  No hydronephrosis on the left. There is a
smaller 4 mm calculus in the lower pole of the left kidney.  There
is a 2 mm calculus within the right mid kidney.  Cortical phase
imaging demonstrates no enhancing renal cortical lesions.
Pyelogram phase imaging demonstrates no filling defects within the
collecting systems or ureters.

Lung bases are clear.  No focal hepatic lesions.  Gallbladder,
pancreas, spleen, adrenal glands are normal.  The stomach, small
bowel, appendix, and colon are normal.

Abdominal aorta normal caliber.  No retroperitoneal
lymphadenopathy.

No free fluid the pelvis.  Bladder is normal.  No pelvic
lymphadenopathy.  No distal ureteral stones or bladder stones.  No
filling defects within the bladder. Review of  bone windows
demonstrates no aggressive osseous lesions.
IMPRESSION: 1.  A 9 mm calculus at the left ureteral pelvic junction without
evidence obstruction.
2. Small left lower pole renal calculus and right mid renal
calculus.

3.  No evidence of ureterolithiasis or obstructive uropathy.

## 2013-07-23 ENCOUNTER — Encounter (HOSPITAL_BASED_OUTPATIENT_CLINIC_OR_DEPARTMENT_OTHER): Payer: Medicare Other | Attending: General Surgery

## 2013-07-23 DIAGNOSIS — M7989 Other specified soft tissue disorders: Secondary | ICD-10-CM | POA: Insufficient documentation

## 2013-07-23 DIAGNOSIS — I10 Essential (primary) hypertension: Secondary | ICD-10-CM | POA: Insufficient documentation

## 2013-07-23 DIAGNOSIS — I890 Lymphedema, not elsewhere classified: Secondary | ICD-10-CM | POA: Insufficient documentation

## 2013-07-23 DIAGNOSIS — I872 Venous insufficiency (chronic) (peripheral): Secondary | ICD-10-CM | POA: Insufficient documentation

## 2013-07-23 DIAGNOSIS — I739 Peripheral vascular disease, unspecified: Secondary | ICD-10-CM | POA: Insufficient documentation

## 2013-07-24 NOTE — Progress Notes (Signed)
[facility]  NAME:  Carlos Wood, Carlos Wood           ACCOUNT NO.:  [id number]  MEDICAL RECORD NO.:  [id number]      DATE OF BIRTH:  08/17/1951  PHYSICIAN:  Ardath Sax, M.D.           VISIT DATE:                                  OFFICE VISIT   A 62 year old morbidly obese male with hypertension and diabetes who enters with venous hypertension and history of chronic venous ulcers of both legs.  The legs are very erythematous and have scars where the old ulcers have epithelialized in.  He has some edema and erythema of the lower half of the calf down into the ankle.  This man has type 2 diabetes and hypertension.  He is on lisinopril and metformin and metoprolol.  He is also on tramadol for pain and Lasix as a diuretic. He weighs 440 pounds and he had a temperature of 97.8, pulse 62, respirations 16, blood pressure 166/84.  We cleansed up his legs and felt that there was no need to put dressings on and we just put compression hose and told him to keep his legs elevated to exercise and when he is up and around to wear the compression hose and to come back here if he ever needed this.  DIAGNOSES: 1. Obesity. 2. Venous stasis. 3. Venous hypertension. 4. Diabetes. 5. Hypertension.     Ardath Sax, M.D.     PP/MEDQ  D:  07/23/2013  T:  07/24/2013  Job:  [id number]

## 2013-08-03 ENCOUNTER — Other Ambulatory Visit: Payer: Self-pay | Admitting: Internal Medicine

## 2013-08-06 ENCOUNTER — Other Ambulatory Visit (INDEPENDENT_AMBULATORY_CARE_PROVIDER_SITE_OTHER): Payer: Medicare Other

## 2013-08-06 ENCOUNTER — Encounter: Payer: Self-pay | Admitting: Internal Medicine

## 2013-08-06 ENCOUNTER — Ambulatory Visit (INDEPENDENT_AMBULATORY_CARE_PROVIDER_SITE_OTHER): Payer: Medicare Other | Admitting: Internal Medicine

## 2013-08-06 ENCOUNTER — Other Ambulatory Visit: Payer: Self-pay | Admitting: Internal Medicine

## 2013-08-06 VITALS — BP 150/72 | HR 70 | Temp 97.1°F | Ht 72.0 in | Wt >= 6400 oz

## 2013-08-06 DIAGNOSIS — E119 Type 2 diabetes mellitus without complications: Secondary | ICD-10-CM

## 2013-08-06 DIAGNOSIS — Z0000 Encounter for general adult medical examination without abnormal findings: Secondary | ICD-10-CM

## 2013-08-06 DIAGNOSIS — M1710 Unilateral primary osteoarthritis, unspecified knee: Secondary | ICD-10-CM

## 2013-08-06 LAB — BASIC METABOLIC PANEL
BUN: 21 mg/dL (ref 6–23)
CO2: 26 mEq/L (ref 19–32)
Calcium: 10 mg/dL (ref 8.4–10.5)
Chloride: 99 mEq/L (ref 96–112)
Creatinine, Ser: 0.6 mg/dL (ref 0.4–1.5)
Glucose, Bld: 187 mg/dL — ABNORMAL HIGH (ref 70–99)
Sodium: 135 mEq/L (ref 135–145)

## 2013-08-06 LAB — PSA: PSA: 0.51 ng/mL (ref 0.10–4.00)

## 2013-08-06 LAB — MICROALBUMIN / CREATININE URINE RATIO
Creatinine,U: 177.3 mg/dL
Microalb Creat Ratio: 3.4 mg/g (ref 0.0–30.0)
Microalb, Ur: 6.1 mg/dL — ABNORMAL HIGH (ref 0.0–1.9)

## 2013-08-06 LAB — CBC WITH DIFFERENTIAL/PLATELET
Basophils Relative: 0.5 % (ref 0.0–3.0)
Eosinophils Absolute: 0.4 10*3/uL (ref 0.0–0.7)
Eosinophils Relative: 4.6 % (ref 0.0–5.0)
Hemoglobin: 15.6 g/dL (ref 13.0–17.0)
Lymphocytes Relative: 30.1 % (ref 12.0–46.0)
MCHC: 33.8 g/dL (ref 30.0–36.0)
Monocytes Relative: 8.7 % (ref 3.0–12.0)
Neutro Abs: 5.2 10*3/uL (ref 1.4–7.7)
Neutrophils Relative %: 56.1 % (ref 43.0–77.0)
RBC: 5.05 Mil/uL (ref 4.22–5.81)
WBC: 9.2 10*3/uL (ref 4.5–10.5)

## 2013-08-06 LAB — HEPATIC FUNCTION PANEL
ALT: 25 U/L (ref 0–53)
Albumin: 4.1 g/dL (ref 3.5–5.2)
Alkaline Phosphatase: 65 U/L (ref 39–117)
Bilirubin, Direct: 0.2 mg/dL (ref 0.0–0.3)
Total Protein: 7.1 g/dL (ref 6.0–8.3)

## 2013-08-06 LAB — URINALYSIS, ROUTINE W REFLEX MICROSCOPIC
Leukocytes, UA: NEGATIVE
Nitrite: NEGATIVE
Urine Glucose: NEGATIVE
WBC, UA: NONE SEEN (ref 0–?)
pH: 5.5 (ref 5.0–8.0)

## 2013-08-06 LAB — LIPID PANEL
Cholesterol: 205 mg/dL — ABNORMAL HIGH (ref 0–200)
Total CHOL/HDL Ratio: 4
Triglycerides: 217 mg/dL — ABNORMAL HIGH (ref 0.0–149.0)

## 2013-08-06 MED ORDER — GLIPIZIDE ER 2.5 MG PO TB24: 2.5000 mg | ORAL_TABLET | Freq: Every day | ORAL | Status: DC

## 2013-08-06 MED ORDER — ATORVASTATIN CALCIUM 20 MG PO TABS: 20.0000 mg | ORAL_TABLET | Freq: Every day | ORAL | Status: AC

## 2013-08-06 MED ORDER — GLIPIZIDE ER 5 MG PO TB24: 5.0000 mg | ORAL_TABLET | Freq: Every day | ORAL | Status: DC

## 2013-08-06 MED ORDER — DICLOFENAC SODIUM 75 MG PO TBEC: 75.0000 mg | DELAYED_RELEASE_TABLET | Freq: Two times a day (BID) | ORAL | Status: AC

## 2013-08-06 MED ORDER — LIDOCAINE 5 % EX PTCH: 1.0000 | MEDICATED_PATCH | CUTANEOUS | Status: AC

## 2013-08-06 MED ORDER — AMLODIPINE BESYLATE 5 MG PO TABS: 5.0000 mg | ORAL_TABLET | Freq: Every day | ORAL | Status: DC

## 2013-08-06 MED ORDER — LISINOPRIL 40 MG PO TABS: 40.0000 mg | ORAL_TABLET | Freq: Every day | ORAL | Status: DC

## 2013-08-06 MED ORDER — TRAMADOL HCL 50 MG PO TABS: 50.0000 mg | ORAL_TABLET | Freq: Three times a day (TID) | ORAL | Status: DC | PRN

## 2013-08-06 MED ORDER — ATORVASTATIN CALCIUM 10 MG PO TABS: 10.0000 mg | ORAL_TABLET | Freq: Every day | ORAL | Status: DC

## 2013-08-06 NOTE — Progress Notes (Signed)
Subjective:    Patient ID: Carlos Wood, male    DOB: 12/15/1950, 62 y.o.   MRN: [id number]  HPI  Here for wellness and f/u;  Overall doing ok;  Pt denies CP, worsening SOB, DOE, wheezing, orthopnea, PND, worsening LE edema, palpitations, dizziness or syncope.  Pt denies neurological change such as new headache, facial or extremity weakness.  Pt denies polydipsia, polyuria, or low sugar symptoms. Pt states overall good compliance with treatment and medications, good tolerability, and has been trying to follow lower cholesterol diet.  Pt denies worsening depressive symptoms, suicidal ideation or panic. No fever, night sweats, wt loss, loss of appetite, or other constitutional symptoms.  Pt states good ability with ADL's, has low fall risk, home safety reviewed and adequate, no other significant changes in hearing or vision, and only occasionally active with exercise.  Has had some transient increased weepiness of persistent LE swelling/redness, just seen per Dr Wynelle [facility] and felt to be ok at that time.  No need for lymphedema pump or other tx at this time.  Decliens immunizations.or colonscopy.  Has renal stones, chronic microhematuria, last saw urology approx 1 yr.  Pt continues to have recurring right knee,no bowel or bladder change, fever, wt loss,  worsening LE pain/numbness/weakness, gait change or falls, or knee giveaways. BP at home with left arm wrist cuff usually < 140/90, declines further med change today  Past Medical History  Diagnosis Date  . Arthritis   . Diabetes mellitus   . Hyperlipidemia   . Hypertension   . Macular degeneration of left eye 07/17/2011  . Arthritis of knee, degenerative 07/17/2011  . Chronic LBP 07/17/2011  . DDD (degenerative disc disease), lumbar 07/17/2011  . Acquired lymphedema of leg 07/17/2011  . History of kidney stones   . Gross hematuria 07/17/2011   No past surgical history on file.  reports that he has never smoked. He does not have any  smokeless tobacco history on file. He reports that he does not drink alcohol or use illicit drugs. family history includes Arthritis in his other; Hyperlipidemia in his other and other; Stroke in his other. Allergies  Allergen Reactions  . Crestor [Rosuvastatin Calcium]     Myalgia  . Pravastatin     Myalgia, mental foggy   Current Outpatient Prescriptions on File Prior to Visit  Medication Sig Dispense Refill  . aspirin 81 MG EC tablet Take 1 tablet (81 mg total) by mouth daily. Swallow whole.  30 tablet  12  . diclofenac (VOLTAREN) 75 MG EC tablet Take 75 mg by mouth 2 (two) times daily.      . furosemide (LASIX) 40 MG tablet Take 1 tablet (40 mg total) by mouth 2 (two) times daily as needed.  100 tablet  2  . metFORMIN (GLUCOPHAGE) 500 MG tablet 2 by mouth 2 times daily  360 tablet  3  . metoprolol tartrate (LOPRESSOR) 25 MG tablet Take 1 tablet (25 mg total) by mouth 2 (two) times daily.  180 tablet  3  . TRAMADOL HCL PO Take by mouth 2 (two) times daily.       No current facility-administered medications on file prior to visit.   Review of Systems Constitutional: Negative for diaphoresis, activity change, appetite change or unexpected weight change.  HENT: Negative for hearing loss, ear pain, facial swelling, mouth sores and neck stiffness.   Eyes: Negative for pain, redness and visual disturbance.  Respiratory: Negative for shortness of breath and wheezing.   Cardiovascular: Negative  for chest pain and palpitations.  Gastrointestinal: Negative for diarrhea, blood in stool, abdominal distention or other pain Genitourinary: Negative for hematuria, flank pain or change in urine volume.  Musculoskeletal: Negative for myalgias and joint swelling.  Skin: Negative for color change and wound.  Neurological: Negative for syncope and numbness. other than noted Hematological: Negative for adenopathy.  Psychiatric/Behavioral: Negative for hallucinations, self-injury, decreased concentration  and agitation.      Objective:   Physical Exam BP 150/72  Pulse 70  Temp(Src) 97.1 F (36.2 C) (Oral)  Ht 6' (1.829 m)  Wt 444 lb 2 oz (201.454 kg)  BMI 60.22 kg/m2  SpO2 93% VS noted,  Constitutional: Pt is oriented to person, place, and time. Appears well-developed and well-nourished.  Head: Normocephalic and atraumatic.  Right Ear: External ear normal.  Left Ear: External ear normal.  Nose: Nose normal.  Mouth/Throat: Oropharynx is clear and moist.  Eyes: Conjunctivae and EOM are normal. Pupils are equal, round, and reactive to light.  Neck: Normal range of motion. Neck supple. No JVD present. No tracheal deviation present.  Cardiovascular: Normal rate, regular rhythm, normal heart sounds and intact distal pulses.   Pulmonary/Chest: Effort normal and breath sounds normal.  Abdominal: Soft. Bowel sounds are normal. There is no tenderness. No HSM  Musculoskeletal: Normal range of motion. Exhibits no edema.  Lymphadenopathy:  Has no cervical adenopathy.  Neurological: Pt is alert and oriented to person, place, and time. Pt has normal reflexes. No cranial nerve deficit.  Skin: Skin is warm and dry. No rash noted. Large lymphedema bilat LE's Psychiatric:  Has  normal mood and affect. Behavior is normal.  Right Knee;    Assessment & Plan:

## 2013-08-06 NOTE — Patient Instructions (Signed)
Please continue all other medications as before, and refills have been done if requested Please have the pharmacy call with any other refills you may need. Please continue your efforts at being more active, low cholesterol diet, and weight control. You are otherwise up to date with prevention measures today. Please keep your appointments with your specialists as you have planned Please continue to monitor your BP at home, with the goal being < 140/90 Please call if you change your mind about seeing Dr Smith/sports medicine for the right knee Please go to the LAB in the Basement (turn left off the elevator) for the tests to be done today You will be contacted by phone if any changes need to be made immediately.  Otherwise, you will receive a letter about your results with an explanation, but please check with MyChart first.  Please remember to sign up for My Chart if you have not done so, as this will be important to you in the future with finding out test results, communicating by private email, and scheduling acute appointments online when needed.  Please return in 6 months, or sooner if needed, with Lab testing done 3-5 days before

## 2013-08-06 NOTE — Assessment & Plan Note (Signed)
stable overall by history and exam, recent data reviewed with pt, and pt to continue medical treatment as before,  to f/u any worsening symptoms or concerns Lab Results  Component Value Date   HGBA1C 8.3* 07/20/2012   For f/u labs

## 2013-08-06 NOTE — Assessment & Plan Note (Signed)

## 2013-08-06 NOTE — Assessment & Plan Note (Signed)
Ok for pain med refills, declines referral to Dr Smith/sports med

## 2013-08-12 ENCOUNTER — Other Ambulatory Visit: Payer: Self-pay

## 2013-10-10 ENCOUNTER — Other Ambulatory Visit: Payer: Self-pay | Admitting: Internal Medicine

## 2013-11-29 ENCOUNTER — Other Ambulatory Visit: Payer: Self-pay | Admitting: Internal Medicine

## 2014-01-10 ENCOUNTER — Encounter: Payer: Self-pay | Admitting: Internal Medicine

## 2014-01-26 ENCOUNTER — Other Ambulatory Visit: Payer: Self-pay | Admitting: Internal Medicine

## 2014-01-26 NOTE — Telephone Encounter (Signed)
Done hardcopy to robin  

## 2014-01-28 NOTE — Telephone Encounter (Signed)
[facility].../[facility]mb 

## 2014-02-02 ENCOUNTER — Other Ambulatory Visit: Payer: Self-pay | Admitting: Internal Medicine

## 2014-02-02 ENCOUNTER — Ambulatory Visit (INDEPENDENT_AMBULATORY_CARE_PROVIDER_SITE_OTHER): Payer: Medicare Other | Admitting: Internal Medicine

## 2014-02-02 ENCOUNTER — Encounter: Payer: Self-pay | Admitting: Internal Medicine

## 2014-02-02 ENCOUNTER — Other Ambulatory Visit (INDEPENDENT_AMBULATORY_CARE_PROVIDER_SITE_OTHER): Payer: Medicare Other

## 2014-02-02 VITALS — BP 120/78 | HR 82 | Temp 97.9°F | Wt >= 6400 oz

## 2014-02-02 DIAGNOSIS — I10 Essential (primary) hypertension: Secondary | ICD-10-CM

## 2014-02-02 DIAGNOSIS — E119 Type 2 diabetes mellitus without complications: Secondary | ICD-10-CM

## 2014-02-02 DIAGNOSIS — E785 Hyperlipidemia, unspecified: Secondary | ICD-10-CM

## 2014-02-02 LAB — BASIC METABOLIC PANEL
BUN: 24 mg/dL — AB (ref 6–23)
CHLORIDE: 97 meq/L (ref 96–112)
CO2: 27 mEq/L (ref 19–32)
CREATININE: 0.9 mg/dL (ref 0.4–1.5)
Calcium: 9 mg/dL (ref 8.4–10.5)
GFR: 87.77 mL/min (ref >60.00)
GLUCOSE: 162 mg/dL — AB (ref 70–99)
Potassium: 4.5 mEq/L (ref 3.5–5.1)
Sodium: 136 mEq/L (ref 135–145)

## 2014-02-02 LAB — LIPID PANEL
CHOLESTEROL: 153 mg/dL (ref 0–200)
HDL: 44 mg/dL (ref >39.00)
LDL Cholesterol: 64 mg/dL (ref 0–99)
Total CHOL/HDL Ratio: 3
Triglycerides: 225 mg/dL — ABNORMAL HIGH (ref 0.0–149.0)
VLDL: 45 mg/dL — ABNORMAL HIGH (ref 0.0–40.0)

## 2014-02-02 LAB — HEMOGLOBIN A1C: HEMOGLOBIN A1C: 7.8 % — AB (ref 4.6–6.5)

## 2014-02-02 LAB — HEPATIC FUNCTION PANEL
ALT: 28 U/L (ref 0–53)
AST: 31 U/L (ref 0–37)
Albumin: 4.1 g/dL (ref 3.5–5.2)
Alkaline Phosphatase: 67 U/L (ref 39–117)
BILIRUBIN DIRECT: 0.1 mg/dL (ref 0.0–0.3)
TOTAL PROTEIN: 7.3 g/dL (ref 6.0–8.3)
Total Bilirubin: 0.9 mg/dL (ref 0.3–1.2)

## 2014-02-02 MED ORDER — GLIPIZIDE ER 10 MG PO TB24: 10.0000 mg | ORAL_TABLET | Freq: Every day | ORAL | Status: AC

## 2014-02-02 NOTE — Assessment & Plan Note (Signed)
stable overall by history and exam, recent data reviewed with pt, and pt to continue medical treatment as before,  to f/u any worsening symptoms or concerns BP Readings from Last 3 Encounters:  02/02/14 120/78  08/06/13 150/72  07/20/12 150/80

## 2014-02-02 NOTE — Progress Notes (Signed)
Subjective:    Patient ID: Carlos Wood, male    DOB: 10/25/1950, 63 y.o.   MRN: [id number]  HPI  Here to f/u; overall doing ok,  Pt denies chest pain, increased sob or doe, wheezing, orthopnea, PND, increased LE swelling, palpitations, dizziness or syncope.  Pt denies polydipsia, polyuria, or low sugar symptoms such as weakness or confusion improved with po intake.  Pt denies new neurological symptoms such as new headache, or facial or extremity weakness or numbness.   Pt states overall good compliance with meds, has been trying to follow lower cholesterol, diabetic diet, with wt overall stable,  but little exercise however.  Cont's to see wound clinic.  Needs letter for permanent out of jury duty. No new complaints.  Declines immunizations and colonsocpy Past Medical History  Diagnosis Date  . Arthritis   . Diabetes mellitus   . Hyperlipidemia   . Hypertension   . Macular degeneration of left eye 07/17/2011  . Arthritis of knee, degenerative 07/17/2011  . Chronic LBP 07/17/2011  . DDD (degenerative disc disease), lumbar 07/17/2011  . Acquired lymphedema of leg 07/17/2011  . History of kidney stones   . Gross hematuria 07/17/2011   No past surgical history on file.  reports that he has never smoked. He does not have any smokeless tobacco history on file. He reports that he does not drink alcohol or use illicit drugs. family history includes Arthritis in his other; Hyperlipidemia in his other and other; Stroke in his other. Allergies  Allergen Reactions  . Crestor [Rosuvastatin Calcium]     Myalgia  . Pravastatin     Myalgia, mental foggy   Current Outpatient Prescriptions on File Prior to Visit  Medication Sig Dispense Refill  . amLODipine (NORVASC) 5 MG tablet Take 1 tablet (5 mg total) by mouth daily.  90 tablet  3  . aspirin 81 MG EC tablet Take 1 tablet (81 mg total) by mouth daily. Swallow whole.  30 tablet  12  . atorvastatin (LIPITOR) 20 MG tablet Take 1 tablet (20 mg  total) by mouth daily.  90 tablet  3  . diclofenac (VOLTAREN) 75 MG EC tablet Take 1 tablet (75 mg total) by mouth 2 (two) times daily.  180 tablet  3  . furosemide (LASIX) 40 MG tablet Take 1 tablet (40 mg total) by mouth 2 (two) times daily as needed.  100 tablet  2  . glipiZIDE (GLIPIZIDE XL) 5 MG 24 hr tablet Take 1 tablet (5 mg total) by mouth daily.  90 tablet  3  . lidocaine (LIDODERM) 5 % Place 1 patch onto the skin daily. Remove & Discard patch within 12 hours or as directed by MD  60 patch  5  . lisinopril (PRINIVIL,ZESTRIL) 40 MG tablet Take 1 tablet (40 mg total) by mouth daily.  90 tablet  3  . metFORMIN (GLUCOPHAGE) 500 MG tablet Take two tablets by mouth twice daily  360 tablet  3  . metoprolol tartrate (LOPRESSOR) 25 MG tablet Take one tablet by mouth twice daily  180 tablet  2  . traMADol (ULTRAM) 50 MG tablet TAKE ONE TABLET BY MOUTH EVERY EIGHT HOURS AS NEEDED FOR PAIN   90 tablet  1   No current facility-administered medications on file prior to visit.   Review of Systems  Constitutional: Negative for unusual diaphoresis or other sweats  HENT: Negative for ringing in ear Eyes: Negative for double vision or worsening visual disturbance.  Respiratory: Negative for choking  and stridor.   Gastrointestinal: Negative for vomiting or other signifcant bowel change Genitourinary: Negative for hematuria or decreased urine volume.  Musculoskeletal: Negative for other MSK pain or swelling Skin: Negative for color change and worsening wound.  Neurological: Negative for tremors and numbness other than noted  Psychiatric/Behavioral: Negative for decreased concentration or agitation other than above       Objective:   Physical Exam BP 120/78  Pulse 82  Temp(Src) 97.9 F (36.6 C) (Oral)  Wt 473 lb 8 oz (214.778 kg)  SpO2 94% VS noted,  Constitutional: Pt appears well-developed, well-nourished.  HENT: Head: NCAT.  Right Ear: External ear normal.  Left Ear: External ear normal.    Eyes: . Pupils are equal, round, and reactive to light. Conjunctivae and EOM are normal Neck: Normal range of motion. Neck supple.  Cardiovascular: Normal rate and regular rhythm.   Pulmonary/Chest: Effort normal and breath sounds normal.  Abd:  Soft, NT, ND, + BS Neurological: Pt is alert. Not confused , motor grossly intact Skin: chronic edema no change Psychiatric: Pt behavior is normal. No agitation.      Assessment & Plan:

## 2014-02-02 NOTE — Patient Instructions (Signed)
Please continue all other medications as before, and refills have been done if requested. Please have the pharmacy call with any other refills you may need.  Please continue your efforts at being more active, low cholesterol diet, and weight control. You are otherwise up to date with prevention measures today.  You are given the updated letter today for jury duty  Please go to the LAB in the Basement (turn left off the elevator) for the tests to be done today  You will be contacted by phone if any changes need to be made immediately.  Otherwise, you will receive a letter about your results with an explanation, but please check with MyChart first.  Please remember to sign up for MyChart if you have not done so, as this will be important to you in the future with finding out test results, communicating by private email, and scheduling acute appointments online when needed.  Please return in 6 months, or sooner if needed

## 2014-02-02 NOTE — Assessment & Plan Note (Signed)
stable overall by history and exam, recent data reviewed with pt, and pt to continue medical treatment as before,  to f/u any worsening symptoms or concerns Lab Results  Component Value Date   HGBA1C 8.1* 08/06/2013   For f/u labs

## 2014-02-02 NOTE — Progress Notes (Signed)
Pre visit review using our clinic review tool, if applicable. No additional management support is needed unless otherwise documented below in the visit note. 

## 2014-02-02 NOTE — Assessment & Plan Note (Signed)
stable overall by history and exam, recent data reviewed with pt, and pt to continue medical treatment as before,  to f/u any worsening symptoms or concerns Lab Results  Component Value Date   LDLCALC 109* 07/20/2012

## 2014-02-03 ENCOUNTER — Encounter: Payer: Self-pay | Admitting: Internal Medicine

## 2014-03-08 ENCOUNTER — Encounter: Payer: Self-pay | Admitting: Internal Medicine

## 2014-04-01 ENCOUNTER — Encounter (HOSPITAL_BASED_OUTPATIENT_CLINIC_OR_DEPARTMENT_OTHER): Payer: Medicare Other | Attending: General Surgery

## 2014-04-01 DIAGNOSIS — Z79899 Other long term (current) drug therapy: Secondary | ICD-10-CM | POA: Insufficient documentation

## 2014-04-01 DIAGNOSIS — L02419 Cutaneous abscess of limb, unspecified: Secondary | ICD-10-CM | POA: Insufficient documentation

## 2014-04-01 DIAGNOSIS — L97809 Non-pressure chronic ulcer of other part of unspecified lower leg with unspecified severity: Secondary | ICD-10-CM | POA: Insufficient documentation

## 2014-04-01 DIAGNOSIS — L03119 Cellulitis of unspecified part of limb: Secondary | ICD-10-CM

## 2014-04-01 DIAGNOSIS — I10 Essential (primary) hypertension: Secondary | ICD-10-CM | POA: Insufficient documentation

## 2014-04-01 DIAGNOSIS — E119 Type 2 diabetes mellitus without complications: Secondary | ICD-10-CM | POA: Insufficient documentation

## 2014-04-01 LAB — GLUCOSE, CAPILLARY: Glucose-Capillary: 235 mg/dL — ABNORMAL HIGH (ref 70–99)

## 2014-04-02 NOTE — Progress Notes (Signed)
[facility]  NAME:  Carlos Wood, Cordale           ACCOUNT NO.:  [id number]  MEDICAL RECORD NO.:  [id number]      DATE OF BIRTH:  December 06, 1950  PHYSICIAN:  Ardath SaxPeter Nabiha Planck, M.D.           VISIT DATE:                                  OFFICE VISIT   This is a 63 year old morbidly obese man weighs about 500 pounds.  He comes in with venous ulcers and cellulitis in both of his lower legs. They are very erythematous and tender with fairly superficial ulcers are obviously infected.  Today, we took cultures of him and I went over his history.  He has a history of obesity, type 2 diabetes, and hypertension.  He has had problems with his venous ulcer situation in the past.  His medicines include Lasix, glipizide, lisinopril, metformin, metoprolol, tramadol, and amlodipine.  He also takes Avastin drug.  This gentleman today was found to have a blood pressure 145/77, pulse 67, temperature 97.  He says he weighs 475 pounds.  I debrided these legs and then I put silver alginate on the ulcers and we wrapped him in Unna boots bilaterally.  I also started him on doxycycline after we took a culture.  We will see him back here in a week.  His diagnosis is venous ulcers bilaterally with secondary cellulitis, also morbid obesity, hypertension, type 2 diabetes.     Ardath SaxPeter Magdelena Kinsella, M.D.     PP/MEDQ  D:  04/01/2014  T:  04/02/2014  Job:  [id number]

## 2014-04-11 ENCOUNTER — Encounter (HOSPITAL_BASED_OUTPATIENT_CLINIC_OR_DEPARTMENT_OTHER): Payer: Medicare Other | Attending: General Surgery

## 2014-04-11 DIAGNOSIS — L84 Corns and callosities: Secondary | ICD-10-CM | POA: Insufficient documentation

## 2014-04-11 DIAGNOSIS — I890 Lymphedema, not elsewhere classified: Secondary | ICD-10-CM | POA: Diagnosis not present

## 2014-04-11 DIAGNOSIS — I872 Venous insufficiency (chronic) (peripheral): Secondary | ICD-10-CM | POA: Diagnosis not present

## 2014-04-11 DIAGNOSIS — L97809 Non-pressure chronic ulcer of other part of unspecified lower leg with unspecified severity: Secondary | ICD-10-CM | POA: Insufficient documentation

## 2014-04-12 NOTE — Progress Notes (Signed)
[facility]  NAME:  Carlos Wood, Carlos           ACCOUNT NO.:  [id number]  MEDICAL RECORD NO.:  [id number]      DATE OF BIRTH:  [birth year]  PHYSICIAN:  Carlos FellowsBrinda Jaqua Ching, MD    VISIT DATE:  04/11/2014                                  OFFICE VISIT   Carlos Wood is morbidly obese, ambulatory male who is here for followup of recurrent bilateral lower extremity ulcerations in setting of venous stasis and lymphedema.  His current wound care has been Radio broadcast assistantUnna boot with silver alginate.  He is doing this once weekly here in the [facility] and does not have home health. He presents today with new compression stocking to use over his left lower extremity, which has healed.  He does have a 4XL wide calf on this compression stocking and it was discussed today that this may not be providing adequate compression.  In 2013, Dr. Jimmey RalphParker attempted to secure a lymphedema pump for him and this request was denied.  We will attempt to do this again as the patient has had over half a dozen visits to the [facility] for recurrent ulcerations over bilateral lower extremities.  I agree with Dr. Jimmey RalphParker that he would benefit from this for long-term maintenance.  The patient himself reports that he had vascular studies done in approximately 2008.  There was no record of this in the electronic medical record.  No ABIs were obtained on his readmission to clinic, likely due to his body habitus.  He has recent laboratory of hemoglobin A1c drawn by his primary care, which was 8.1.  There are no other recent laboratories.  On examination, he has dark discoloration over the plantar surfaces of both halluces.  The patient states that this is from recent trimming of his hypertrophic calluses.  He does not wear diabetic shoes.  PHYSICAL EXAMINATION:  Weight is 475 pounds, height is 6 feet, blood pressure is 179/79, pulse is 78, temperature is 98.  Left calf circumference is 52.3 cm, which is  significantly improved.  The left ankle is 29.7 cm, right calf is 48.7 cm, right ankle is 29.2 cm.  There is no visible cellulitis.  He does have discoloration consistent with chronic venous stasis.  The patient was prescribed antibiotics, and wound culture was obtained by Dr. Jimmey RalphParker as last visit, wound culture is negative.  Left lower extremity wounds have all completely healed.  Over the right lower extremity, he has ulcerations measured as 12 x 4.5 x 0.1 cm.  The wound is completely granulated.  There is no slough present. He has copious amount of drainage present.  We will continue with silver alginate and Unna boot.  I will defer to Dr. Jimmey RalphParker regarding increasing his compression and we will attempt to order a lymphedema pump again for him.  He will follow up with Dr. Jimmey RalphParker in 1 week's time.          ______________________________ Carlos FellowsBrinda Yitzchak Kothari, MD MBA     BT/MEDQ  D:  04/11/2014  T:  04/12/2014  Job:  [id number]

## 2014-04-13 ENCOUNTER — Other Ambulatory Visit: Payer: Self-pay | Admitting: Internal Medicine

## 2014-04-14 ENCOUNTER — Telehealth: Payer: Self-pay

## 2014-04-14 NOTE — Telephone Encounter (Signed)
PA Lidocaine recvd and entered in cover my meds.

## 2014-04-15 NOTE — Telephone Encounter (Signed)
Received Denial for lidocaine 5% patches.  advise

## 2014-04-15 NOTE — Telephone Encounter (Signed)
Unfortunately I have nothing else similar to offer at this time

## 2014-04-15 NOTE — Telephone Encounter (Signed)
Patient informed of denial

## 2014-04-19 ENCOUNTER — Other Ambulatory Visit: Payer: Self-pay | Admitting: Internal Medicine

## 2014-04-19 NOTE — Telephone Encounter (Signed)
Faxed hardcopy to [facility] [address] [place] 

## 2014-04-19 NOTE — Telephone Encounter (Signed)
Done hardcopy to robin  

## 2014-04-22 DIAGNOSIS — I890 Lymphedema, not elsewhere classified: Secondary | ICD-10-CM | POA: Diagnosis not present

## 2014-04-22 DIAGNOSIS — I872 Venous insufficiency (chronic) (peripheral): Secondary | ICD-10-CM | POA: Diagnosis not present

## 2014-04-22 DIAGNOSIS — L84 Corns and callosities: Secondary | ICD-10-CM | POA: Diagnosis not present

## 2014-04-22 DIAGNOSIS — L97809 Non-pressure chronic ulcer of other part of unspecified lower leg with unspecified severity: Secondary | ICD-10-CM | POA: Diagnosis not present

## 2014-04-22 LAB — GLUCOSE, CAPILLARY: GLUCOSE-CAPILLARY: 209 mg/dL — AB (ref 70–99)

## 2014-07-03 ENCOUNTER — Other Ambulatory Visit: Payer: Self-pay | Admitting: Internal Medicine

## 2014-07-05 NOTE — Telephone Encounter (Signed)
Done hardcopy to robin  

## 2014-07-06 ENCOUNTER — Encounter: Payer: Self-pay | Admitting: Internal Medicine

## 2014-07-06 ENCOUNTER — Other Ambulatory Visit: Payer: Self-pay | Admitting: Internal Medicine

## 2014-07-06 NOTE — Telephone Encounter (Signed)
Tramadol just done sept 29

## 2014-07-07 ENCOUNTER — Other Ambulatory Visit: Payer: Self-pay | Admitting: Internal Medicine

## 2014-07-13 ENCOUNTER — Other Ambulatory Visit: Payer: Self-pay | Admitting: Internal Medicine

## 2014-07-13 ENCOUNTER — Encounter: Payer: Self-pay | Admitting: Internal Medicine

## 2014-07-13 ENCOUNTER — Telehealth: Payer: Self-pay | Admitting: Internal Medicine

## 2014-07-13 MED ORDER — TRAMADOL HCL 50 MG PO TABS: ORAL_TABLET | ORAL | Status: AC

## 2014-07-13 NOTE — Telephone Encounter (Signed)
Robin to see above 

## 2014-07-13 NOTE — Telephone Encounter (Signed)
Done hardcopy to robin  

## 2014-07-13 NOTE — Telephone Encounter (Signed)
Faxed hardcopy for Tramadol 50 mg #90 with 2 refills to [facility] [address] [place].  Patient has been informed by email refill has been done.

## 2014-07-14 ENCOUNTER — Telehealth: Payer: Self-pay | Admitting: Internal Medicine

## 2014-07-14 NOTE — Telephone Encounter (Signed)
Dismissal Letter sent by Certified Mail 07/14/2014  Received the Return Receipt showing someone picked up the Dismissal 07/26/2014

## 2014-07-22 ENCOUNTER — Other Ambulatory Visit: Payer: Self-pay

## 2014-07-26 ENCOUNTER — Encounter: Payer: Self-pay | Admitting: Internal Medicine

## 2014-07-26 MED ORDER — LISINOPRIL 40 MG PO TABS: 40.0000 mg | ORAL_TABLET | Freq: Every day | ORAL | Status: AC

## 2014-07-26 MED ORDER — AMLODIPINE BESYLATE 5 MG PO TABS: 5.0000 mg | ORAL_TABLET | Freq: Every day | ORAL | Status: AC

## 2014-07-26 MED ORDER — METOPROLOL TARTRATE 25 MG PO TABS: ORAL_TABLET | ORAL | Status: AC

## 2014-07-26 NOTE — Telephone Encounter (Signed)
Done per pt mychart reqeust

## 2014-07-28 ENCOUNTER — Encounter: Payer: Self-pay | Admitting: Internal Medicine

## 2014-08-09 ENCOUNTER — Ambulatory Visit: Payer: Medicare Other | Admitting: Internal Medicine

## 2015-04-03 ENCOUNTER — Other Ambulatory Visit: Payer: Self-pay

## 2015-07-24 ENCOUNTER — Other Ambulatory Visit: Payer: Self-pay | Admitting: Internal Medicine

## 2015-07-26 ENCOUNTER — Other Ambulatory Visit: Payer: Self-pay | Admitting: Internal Medicine

## 2015-07-29 ENCOUNTER — Other Ambulatory Visit: Payer: Self-pay | Admitting: Internal Medicine

## 2018-01-19 ENCOUNTER — Encounter (HOSPITAL_BASED_OUTPATIENT_CLINIC_OR_DEPARTMENT_OTHER): Payer: Medicare Other | Attending: Internal Medicine

## 2018-01-19 ENCOUNTER — Other Ambulatory Visit (HOSPITAL_BASED_OUTPATIENT_CLINIC_OR_DEPARTMENT_OTHER): Payer: Self-pay | Admitting: Internal Medicine

## 2018-01-19 ENCOUNTER — Ambulatory Visit (HOSPITAL_COMMUNITY)
Admission: RE | Admit: 2018-01-19 | Discharge: 2018-01-19 | Disposition: A | Discharge status: Home / Self Care | Payer: Medicare Other | Source: Ambulatory Visit | Attending: Internal Medicine | Admitting: Internal Medicine

## 2018-01-19 DIAGNOSIS — L03115 Cellulitis of right lower limb: Secondary | ICD-10-CM | POA: Insufficient documentation

## 2018-01-19 DIAGNOSIS — I87333 Chronic venous hypertension (idiopathic) with ulcer and inflammation of bilateral lower extremity: Secondary | ICD-10-CM | POA: Insufficient documentation

## 2018-01-19 DIAGNOSIS — L97529 Non-pressure chronic ulcer of other part of left foot with unspecified severity: Secondary | ICD-10-CM | POA: Diagnosis present

## 2018-01-19 DIAGNOSIS — S31103A Unspecified open wound of abdominal wall, right lower quadrant without penetration into peritoneal cavity, initial encounter: Secondary | ICD-10-CM | POA: Diagnosis not present

## 2018-01-19 DIAGNOSIS — L97822 Non-pressure chronic ulcer of other part of left lower leg with fat layer exposed: Secondary | ICD-10-CM | POA: Insufficient documentation

## 2018-01-19 DIAGNOSIS — G4730 Sleep apnea, unspecified: Secondary | ICD-10-CM | POA: Diagnosis not present

## 2018-01-19 DIAGNOSIS — E1140 Type 2 diabetes mellitus with diabetic neuropathy, unspecified: Secondary | ICD-10-CM | POA: Diagnosis not present

## 2018-01-19 DIAGNOSIS — B354 Tinea corporis: Secondary | ICD-10-CM | POA: Diagnosis not present

## 2018-01-19 DIAGNOSIS — L97812 Non-pressure chronic ulcer of other part of right lower leg with fat layer exposed: Secondary | ICD-10-CM | POA: Diagnosis not present

## 2018-01-19 DIAGNOSIS — I890 Lymphedema, not elsewhere classified: Secondary | ICD-10-CM | POA: Insufficient documentation

## 2018-01-19 DIAGNOSIS — L97523 Non-pressure chronic ulcer of other part of left foot with necrosis of muscle: Secondary | ICD-10-CM | POA: Diagnosis not present

## 2018-01-19 DIAGNOSIS — I10 Essential (primary) hypertension: Secondary | ICD-10-CM | POA: Insufficient documentation

## 2018-01-19 DIAGNOSIS — X58XXXA Exposure to other specified factors, initial encounter: Secondary | ICD-10-CM | POA: Diagnosis not present

## 2018-01-19 DIAGNOSIS — M7989 Other specified soft tissue disorders: Secondary | ICD-10-CM | POA: Insufficient documentation

## 2018-01-19 DIAGNOSIS — E11621 Type 2 diabetes mellitus with foot ulcer: Secondary | ICD-10-CM | POA: Insufficient documentation

## 2018-01-26 DIAGNOSIS — I87333 Chronic venous hypertension (idiopathic) with ulcer and inflammation of bilateral lower extremity: Secondary | ICD-10-CM | POA: Diagnosis not present

## 2018-02-02 DIAGNOSIS — I87333 Chronic venous hypertension (idiopathic) with ulcer and inflammation of bilateral lower extremity: Secondary | ICD-10-CM | POA: Diagnosis not present

## 2018-02-03 ENCOUNTER — Encounter (HOSPITAL_COMMUNITY): Payer: Medicare Other

## 2018-02-09 ENCOUNTER — Encounter (HOSPITAL_BASED_OUTPATIENT_CLINIC_OR_DEPARTMENT_OTHER): Payer: Medicare Other

## 2018-03-07 DEATH — deceased

## 2019-09-22 IMAGING — DX DG TOE GREAT 2+V*L*
2 series · 3 of 3 positions shown · non-contrast
Comparison: None.

CLINICAL DATA: Nonhealing wound

EXAM:
LEFT FIRST TOE: 3 V

[toe ap]
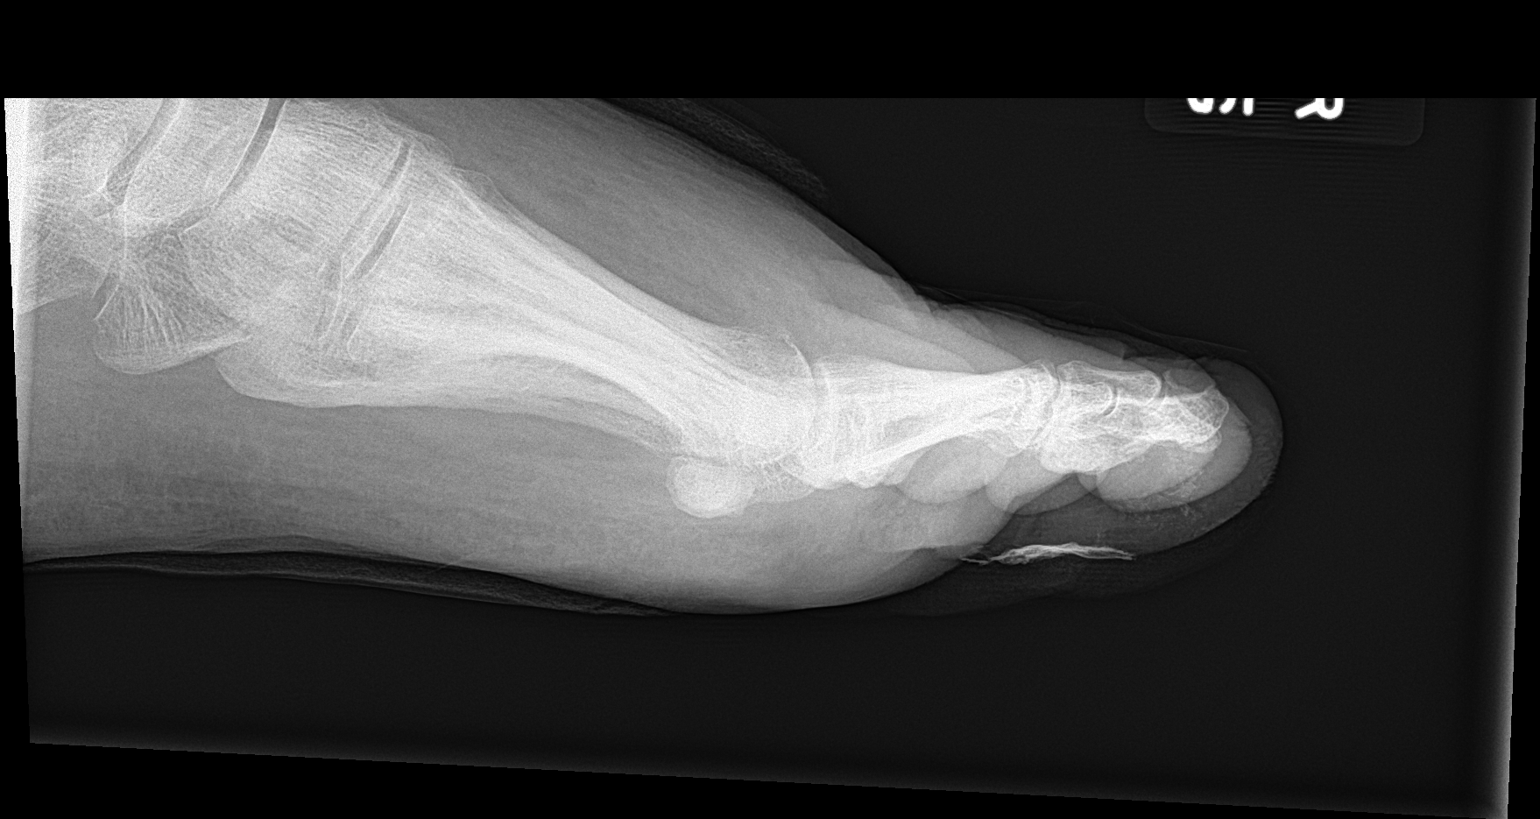

[Series 2: toe obl · 0.14mm/px · 2 of 2 slices shown]
[im 1/2]
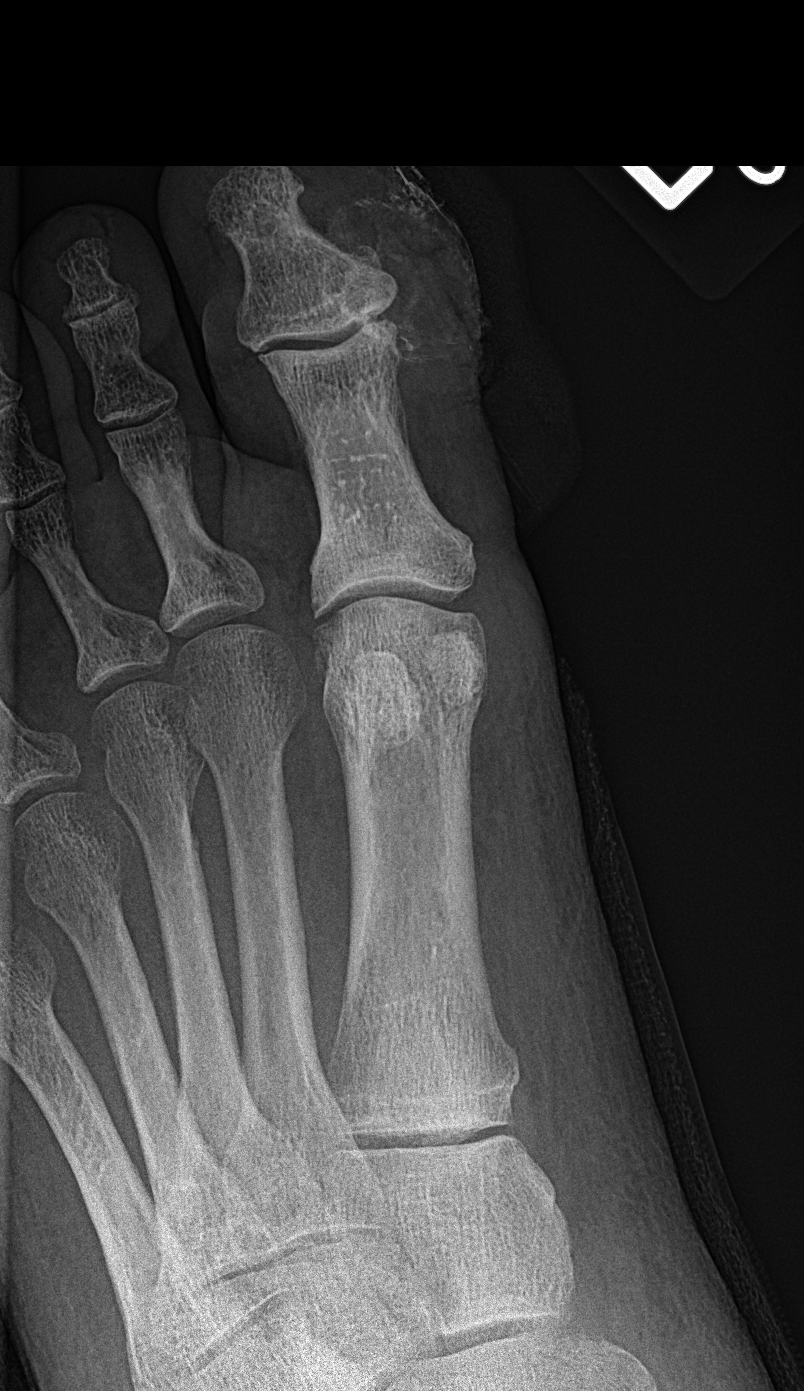
[im 2/2]
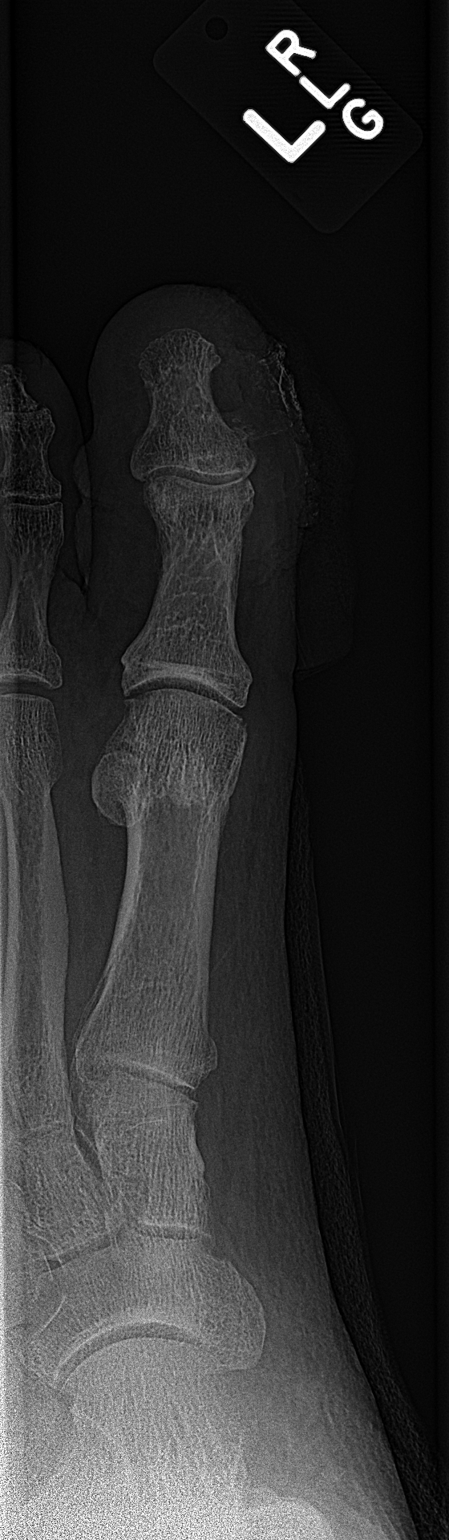

[3 of 3 positions shown; findings below may reference images not displayed]

FINDINGS: Frontal, oblique, and lateral views were obtained. There is bandage
overlying the volar and medial aspect of the first digit. No
fracture or dislocation. No erosive change or bony destruction. No
appreciable joint space narrowing. There is soft tissue swelling
over the dorsum of the foot.
IMPRESSION: Overlying bandage. No bony destruction or erosion. No fracture or
dislocation. Soft tissue swelling over dorsum of foot.

## 2024-04-23 ENCOUNTER — Encounter: Payer: Self-pay | Admitting: Advanced Practice Midwife
